# Patient Record
Sex: Female | Born: 1975 | Race: White | Hispanic: No | Marital: Married | Smoking: Never smoker
Health system: Southern US, Community
[De-identification: ages and names within clinical notes are randomized; demographics above are authoritative.]

## PROBLEM LIST (undated history)

## (undated) DIAGNOSIS — K589 Irritable bowel syndrome without diarrhea: Principal | ICD-10-CM

## (undated) DIAGNOSIS — L719 Rosacea, unspecified: Secondary | ICD-10-CM

## (undated) HISTORY — PX: ENDOMETRIAL ABLATION: SHX621

## (undated) HISTORY — DX: Irritable bowel syndrome without diarrhea: K58.9

## (undated) HISTORY — DX: Rosacea, unspecified: L71.9

---

## 2007-10-04 LAB — CONVERTED CEMR LAB

## 2008-03-19 ENCOUNTER — Ambulatory Visit: Payer: Self-pay | Admitting: Internal Medicine

## 2009-09-05 ENCOUNTER — Encounter (INDEPENDENT_AMBULATORY_CARE_PROVIDER_SITE_OTHER): Payer: Self-pay | Admitting: Obstetrics and Gynecology

## 2009-09-05 ENCOUNTER — Inpatient Hospital Stay (HOSPITAL_COMMUNITY): Admission: AD | Admit: 2009-09-05 | Discharge: 2009-09-08 | Payer: Self-pay | Admitting: Obstetrics and Gynecology

## 2010-03-25 ENCOUNTER — Encounter: Payer: Self-pay | Admitting: Internal Medicine

## 2010-07-05 NOTE — Miscellaneous (Signed)
Summary: flu shot  Clinical Lists Changes  Observations: Added new observation of FLU VAX: Historical (03/24/2010 11:23)      Immunization History:  Influenza Immunization History:    Influenza:  historical (03/24/2010) Vaccinse given at [facility] Lot# [id number] Exp 12/03/2010 Fl[facility]

## 2010-08-24 LAB — CBC
HCT: 31.6 % — ABNORMAL LOW (ref 36.0–46.0)
HCT: 43.2 % (ref 36.0–46.0)
Hemoglobin: 14.4 g/dL (ref 12.0–15.0)
MCHC: 33.3 g/dL (ref 30.0–36.0)
MCHC: 33.8 g/dL (ref 30.0–36.0)
MCV: 90.4 fL (ref 78.0–100.0)
RBC: 3.5 MIL/uL — ABNORMAL LOW (ref 3.87–5.11)
RBC: 4.82 MIL/uL (ref 3.87–5.11)
RDW: 13.2 % (ref 11.5–15.5)
WBC: 14.4 10*3/uL — ABNORMAL HIGH (ref 4.0–10.5)

## 2010-10-29 ENCOUNTER — Emergency Department (HOSPITAL_COMMUNITY): Payer: Private Health Insurance - Indemnity

## 2010-10-29 ENCOUNTER — Ambulatory Visit (INDEPENDENT_AMBULATORY_CARE_PROVIDER_SITE_OTHER): Payer: Private Health Insurance - Indemnity | Admitting: Family Medicine

## 2010-10-29 ENCOUNTER — Encounter: Payer: Self-pay | Admitting: Family Medicine

## 2010-10-29 ENCOUNTER — Telehealth: Payer: Self-pay | Admitting: *Deleted

## 2010-10-29 ENCOUNTER — Encounter: Payer: Self-pay | Admitting: *Deleted

## 2010-10-29 ENCOUNTER — Other Ambulatory Visit: Payer: Self-pay | Admitting: Family Medicine

## 2010-10-29 ENCOUNTER — Emergency Department (HOSPITAL_COMMUNITY)
Admission: EM | Admit: 2010-10-29 | Discharge: 2010-10-29 | Disposition: A | Discharge status: Home / Self Care | Payer: Private Health Insurance - Indemnity | Attending: Emergency Medicine | Admitting: Emergency Medicine

## 2010-10-29 DIAGNOSIS — R300 Dysuria: Secondary | ICD-10-CM | POA: Insufficient documentation

## 2010-10-29 DIAGNOSIS — R509 Fever, unspecified: Secondary | ICD-10-CM | POA: Insufficient documentation

## 2010-10-29 DIAGNOSIS — R1031 Right lower quadrant pain: Secondary | ICD-10-CM | POA: Insufficient documentation

## 2010-10-29 DIAGNOSIS — R110 Nausea: Secondary | ICD-10-CM | POA: Insufficient documentation

## 2010-10-29 DIAGNOSIS — N12 Tubulo-interstitial nephritis, not specified as acute or chronic: Secondary | ICD-10-CM | POA: Insufficient documentation

## 2010-10-29 DIAGNOSIS — N939 Abnormal uterine and vaginal bleeding, unspecified: Secondary | ICD-10-CM

## 2010-10-29 DIAGNOSIS — M545 Low back pain: Secondary | ICD-10-CM

## 2010-10-29 DIAGNOSIS — N898 Other specified noninflammatory disorders of vagina: Secondary | ICD-10-CM

## 2010-10-29 DIAGNOSIS — R109 Unspecified abdominal pain: Secondary | ICD-10-CM | POA: Insufficient documentation

## 2010-10-29 LAB — URINALYSIS, ROUTINE W REFLEX MICROSCOPIC
Glucose, UA: NEGATIVE mg/dL
Ketones, ur: 40 mg/dL — AB
pH: 6 (ref 5.0–8.0)

## 2010-10-29 LAB — CBC
HCT: 39.4 % (ref 36.0–46.0)
Hemoglobin: 13.4 g/dL (ref 12.0–15.0)
MCH: 29.5 pg (ref 26.0–34.0)
MCV: 86.8 fL (ref 78.0–100.0)
RBC: 4.54 MIL/uL (ref 3.87–5.11)

## 2010-10-29 LAB — DIFFERENTIAL
Basophils Relative: 0 % (ref 0–1)
Lymphocytes Relative: 7 % — ABNORMAL LOW (ref 12–46)
Lymphs Abs: 1.1 10*3/uL (ref 0.7–4.0)
Monocytes Relative: 11 % (ref 3–12)
Neutro Abs: 14.1 10*3/uL — ABNORMAL HIGH (ref 1.7–7.7)
Neutrophils Relative %: 83 % — ABNORMAL HIGH (ref 43–77)

## 2010-10-29 LAB — COMPREHENSIVE METABOLIC PANEL
AST: 18 U/L (ref 0–37)
Albumin: 3.7 g/dL (ref 3.5–5.2)
BUN: 7 mg/dL (ref 6–23)
Chloride: 100 mEq/L (ref 96–112)
Creatinine, Ser: 0.54 mg/dL (ref 0.4–1.2)
GFR calc Af Amer: >60 mL/min (ref >60)
Potassium: 3.5 mEq/L (ref 3.5–5.1)
Total Bilirubin: 0.7 mg/dL (ref 0.3–1.2)
Total Protein: 7.1 g/dL (ref 6.0–8.3)

## 2010-10-29 LAB — URINE MICROSCOPIC-ADD ON

## 2010-10-29 MED ORDER — IOHEXOL 300 MG/ML  SOLN
100.0000 mL | Freq: Once | INTRAMUSCULAR | Status: AC | PRN
  Administered 2010-10-29: 100 mL via INTRAVENOUS

## 2010-10-29 NOTE — Progress Notes (Signed)
Seen Thursday at [facility].  Was told she had a hernia at C/s line (c/s 09/2009).  Fever yesterday PM of 101.  Took some tylenol.  Woke shivering at 2AM.  Started spotting Monday/Tuesday (OB aware and pt was told to observe this), resolved but then had some spotting this AM.  Last regular period was 10/07/10.  No prev spotting like this before.  Had been using condoms.  Not known to be pregnant.  Lower back pain yesterday and upper back pain today.  No cough, ST. No sick contacts.  No diarrhea.  No pelvic pain.  She had some dysuria with burning and urgency, usually in the AM but then it self resolves later in the day, happening in the last week.  Fever documented here.    Otherwise healthy except for rosacea.   NKDA  Meds: Finacea and prenatal vitamin.  nad but sweaty ncat Mmm Tm wnl Neck supple w/o LA rrr ctab Back with R mild CVA pain Abdomen without rebound but epigastric tenderness and tenderness in RLQ at hernia site Ext well perfused.   Upreg pending (was sent to lab at [facility])

## 2010-10-29 NOTE — Telephone Encounter (Signed)
History update

## 2010-10-29 NOTE — Patient Instructions (Signed)
Go to the ER at [facility].   

## 2010-10-29 NOTE — Assessment & Plan Note (Addendum)
Unclear source.  With abd pain and spotting, she may need abd imaging along with urine eval.  I d/w EDP and patient. She'll go across the street to [facility] ER for further eval.  She agrees with plan.  No change on visit.   Addendum UPREG negative.

## 2010-10-31 LAB — URINE CULTURE: Culture  Setup Time: 201205262024

## 2010-11-01 ENCOUNTER — Telehealth: Payer: Self-pay | Admitting: Internal Medicine

## 2010-11-01 ENCOUNTER — Telehealth: Payer: Self-pay

## 2010-11-01 NOTE — Telephone Encounter (Signed)
[facility] Triage Call Report Triage Record Num: [id number] Operator: Albertine Grates Patient Name: Melissa Crawford Call Date & Time: 10/28/2010 5:56:01PM Patient Phone: [phone] PCP: Sanda Linger Patient Gender: Female PCP Fax : Patient DOB: 1976/03/01 Practice Name: [facility] Reason for Call: Was seen by GYN 5-24 and diagnosed with hernia. Has fever 101.3 and is nauseated 5-25. Denies pain. Has been drinking fluids. Home care advice given. Protocol(s) Used: Nausea or Vomiting Recommended Outcome per Protocol: Provide Home/Self Care Reason for Outcome: All other situations Care Advice: Call provider if symptoms continue for 24 hours, blood in vomit, severe abdominal pain, fever over 101.5 F (38.6 C), rapid breathing or pulse, or severe vomiting with diarrhea. ~ 10/28/2010 6:03:41PM Page 1 of 1 [email or web address]

## 2010-11-07 NOTE — Telephone Encounter (Signed)
Done

## 2010-12-06 ENCOUNTER — Encounter (INDEPENDENT_AMBULATORY_CARE_PROVIDER_SITE_OTHER): Payer: Self-pay | Admitting: Surgery

## 2010-12-15 ENCOUNTER — Encounter (INDEPENDENT_AMBULATORY_CARE_PROVIDER_SITE_OTHER): Payer: Self-pay | Admitting: Surgery

## 2010-12-19 ENCOUNTER — Ambulatory Visit (INDEPENDENT_AMBULATORY_CARE_PROVIDER_SITE_OTHER): Payer: Private Health Insurance - Indemnity | Admitting: Surgery

## 2010-12-19 DIAGNOSIS — R1032 Left lower quadrant pain: Secondary | ICD-10-CM

## 2010-12-19 NOTE — Progress Notes (Signed)
Subjective:     Patient ID: Melissa Crawford, female   DOB: 19-Oct-1975, 35 y.o.   MRN: [id number]  HPI  She is here for a one-month followup. She has resolved her pyelonephritis. She has had no episodes of groin pain. She has noticed no masses in her groin. She has been doing full activities without problems. Review of Systems     Objective:   Physical Exam    On exam, I feel no evidence of inguinal or femoral hernia.Assessment:     Resolved left groin pain    Plan:        I will see her as needed

## 2011-05-08 ENCOUNTER — Encounter: Payer: Self-pay | Admitting: Internal Medicine

## 2011-05-08 ENCOUNTER — Ambulatory Visit (INDEPENDENT_AMBULATORY_CARE_PROVIDER_SITE_OTHER): Payer: Private Health Insurance - Indemnity | Admitting: Internal Medicine

## 2011-05-08 VITALS — BP 110/80 | HR 62 | Temp 97.3°F | Resp 16 | Wt 136.2 lb

## 2011-05-08 DIAGNOSIS — J029 Acute pharyngitis, unspecified: Secondary | ICD-10-CM

## 2011-05-08 DIAGNOSIS — Z23 Encounter for immunization: Secondary | ICD-10-CM

## 2011-05-08 MED ORDER — AMOXICILLIN 500 MG PO CAPS: 500.0000 mg | ORAL_CAPSULE | Freq: Three times a day (TID) | ORAL | Status: AC

## 2011-05-08 NOTE — Progress Notes (Signed)
  Subjective:    Patient ID: Melissa Crawford, female    DOB: 1976/01/23, 35 y.o.   MRN: [id number]  Sore Throat  This is a new problem. Episode onset: 10 days. The problem has been unchanged. Neither side of throat is experiencing more pain than the other. There has been no fever. The pain is at a severity of 1/10. The pain is mild. Pertinent negatives include no abdominal pain, congestion, coughing, diarrhea, drooling, ear discharge, hoarse voice, plugged ear sensation, neck pain, shortness of breath, stridor, swollen glands, trouble swallowing or vomiting. She has had exposure to strep. She has tried nothing for the symptoms.      Review of Systems  Constitutional: Negative for fever, chills, diaphoresis, activity change, appetite change, fatigue and unexpected weight change.  HENT: Positive for sore throat. Negative for congestion, hoarse voice, facial swelling, drooling, trouble swallowing, neck pain, voice change and ear discharge.   Eyes: Negative.   Respiratory: Negative for apnea, cough, choking, chest tightness, shortness of breath, wheezing and stridor.   Cardiovascular: Negative for chest pain, palpitations and leg swelling.  Gastrointestinal: Negative for vomiting, abdominal pain, diarrhea and abdominal distention.  Genitourinary: Negative.   Musculoskeletal: Negative for myalgias, back pain, joint swelling, arthralgias and gait problem.  Skin: Negative for color change, pallor, rash and wound.  Neurological: Negative.   Hematological: Negative for adenopathy. Does not bruise/bleed easily.  Psychiatric/Behavioral: Negative.        Objective:   Physical Exam  Vitals reviewed. Constitutional: She is oriented to person, place, and time. She appears well-developed and well-nourished. No distress.  HENT:  Head: No trismus in the jaw.  Right Ear: Hearing, tympanic membrane, external ear and ear canal normal.  Left Ear: Hearing, tympanic membrane, external ear and ear canal normal.   Nose: Nose normal.  Mouth/Throat: Mucous membranes are normal. Mucous membranes are not pale, not dry and not cyanotic. No uvula swelling. Posterior oropharyngeal erythema present. No oropharyngeal exudate, posterior oropharyngeal edema or tonsillar abscesses.  Eyes: Conjunctivae are normal. Right eye exhibits no discharge. Left eye exhibits no discharge. No scleral icterus.  Neck: Normal range of motion. Neck supple. No JVD present. No tracheal deviation present. No thyromegaly present.  Cardiovascular: Normal rate, regular rhythm, normal heart sounds and intact distal pulses.  Exam reveals no gallop and no friction rub.   No murmur heard. Pulmonary/Chest: Effort normal and breath sounds normal. No stridor. No respiratory distress. She has no wheezes. She has no rales. She exhibits no tenderness.  Abdominal: Soft. Bowel sounds are normal. She exhibits no distension and no mass. There is no tenderness. There is no rebound.  Musculoskeletal: Normal range of motion. She exhibits no edema and no tenderness.  Lymphadenopathy:    She has no cervical adenopathy.  Neurological: She is oriented to person, place, and time.  Skin: Skin is warm and dry. No rash noted. She is not diaphoretic. No erythema. No pallor.  Psychiatric: She has a normal mood and affect. Her behavior is normal. Judgment and thought content normal.          Assessment & Plan:

## 2011-05-08 NOTE — Assessment & Plan Note (Addendum)
She has a known exposure to strep so will start amoxicillin and give her pt ed material was well

## 2011-05-08 NOTE — Patient Instructions (Signed)
Strep Throat     Strep throat is an infection of the throat caused by a bacteria named Streptococcus pyogenes. Your caregiver may call the infection streptococcal "tonsillitis" or "pharyngitis" depending on whether there are signs of inflammation in the tonsils or back of the throat. Strep throat is most common in children from 5 to 33 years old during the cold months of the year, but it can occur in people of any age during any season. This infection is spread from person to person (contagious) through coughing, sneezing, or other close contact.  SYMPTOMS   · Fever or chills.   · Painful, swollen, red tonsils or throat.   · Pain or difficulty when swallowing.   · White or yellow spots on the tonsils or throat.   · Swollen, tender lymph nodes or "glands" of the neck or under the jaw.   · Red rash all over the body (rare).   DIAGNOSIS   Many different infections can cause the same symptoms. A test must be done to confirm the diagnosis so the right treatment can be given. A "rapid strep test" can help your caregiver make the diagnosis in a few minutes. If this test is not available, a light swab of the infected area can be used for a throat culture test. If a throat culture test is done, results are usually available in a day or two.  TREATMENT   Strep throat is treated with antibiotic medicine.  HOME CARE INSTRUCTIONS   · Gargle with 1 tsp of salt in 1 cup of warm water, 3 to 4 times per day or as needed for comfort.   · Family members who also have a sore throat or fever should be tested for strep throat and treated with antibiotics if they have the strep infection.   · Make sure everyone in your household washes their hands well.   · Do not share food, drinking cups, or personal items that could cause the infection to spread to others.   · You may need to eat a soft food diet until your sore throat gets better.   · Drink enough water and fluids to keep your urine clear or pale yellow. This will help prevent  dehydration.   · Get plenty of rest.   · Stay home from school, daycare, or work until you have been on antibiotics for 24 hours.   · Only take over-the-counter or prescription medicines for pain, discomfort, or fever as directed by your caregiver.   · If antibiotics are prescribed, take them as directed. Finish them even if you start to feel better.   SEEK MEDICAL CARE IF:   · The glands in your neck continue to enlarge.   · You develop a rash, cough, or earache.   · You cough up green, yellow-brown, or bloody sputum.   · You have pain or discomfort not controlled by medicines.   · Your problems seem to be getting worse rather than better.   SEEK IMMEDIATE MEDICAL CARE IF:   · You develop any new symptoms such as vomiting, severe headache, stiff or painful neck, chest pain, shortness of breath, or trouble swallowing.   · You develop severe throat pain, drooling, or changes in your voice.   · You develop swelling of the neck, or the skin on the neck becomes red and tender.   · You have a fever.   · You develop signs of dehydration, such as fatigue, dry mouth, and decreased urination.   · 

## 2011-05-22 ENCOUNTER — Encounter: Payer: Self-pay | Admitting: Internal Medicine

## 2011-05-22 ENCOUNTER — Ambulatory Visit (INDEPENDENT_AMBULATORY_CARE_PROVIDER_SITE_OTHER): Payer: Private Health Insurance - Indemnity | Admitting: Internal Medicine

## 2011-05-22 ENCOUNTER — Other Ambulatory Visit: Payer: Private Health Insurance - Indemnity

## 2011-05-22 VITALS — BP 110/80 | HR 88 | Temp 98.2°F | Resp 16 | Wt 136.0 lb

## 2011-05-22 DIAGNOSIS — N390 Urinary tract infection, site not specified: Secondary | ICD-10-CM

## 2011-05-22 LAB — POCT URINALYSIS DIPSTICK
Ketones, UA: NEGATIVE
Spec Grav, UA: <=1.005
Urobilinogen, UA: 0.2
pH, UA: 7.5

## 2011-05-22 MED ORDER — SULFAMETHOXAZOLE-TRIMETHOPRIM 800-160 MG PO TABS: 1.0000 | ORAL_TABLET | Freq: Two times a day (BID) | ORAL | Status: DC

## 2011-05-22 NOTE — Assessment & Plan Note (Signed)
UA is + so will start bactrim and end for a clx

## 2011-05-22 NOTE — Patient Instructions (Signed)

## 2011-05-22 NOTE — Progress Notes (Signed)
  Subjective:    Patient ID: Melissa Crawford, female    DOB: 1976/03/22, 35 y.o.   MRN: [id number]  Dysuria  This is a new problem. The current episode started in the past 7 days. The problem occurs intermittently. The problem has been gradually worsening. The quality of the pain is described as burning. The pain is at a severity of 1/10. There has been no fever. She is sexually active. There is no history of pyelonephritis. Associated symptoms include flank pain, frequency and urgency. Pertinent negatives include no chills, discharge, hematuria, hesitancy, nausea, possible pregnancy, sweats or vomiting. She has tried nothing for the symptoms.      Review of Systems  Constitutional: Negative for fever, chills, diaphoresis, activity change, appetite change, fatigue and unexpected weight change.  HENT: Negative.   Eyes: Negative.   Respiratory: Negative.   Cardiovascular: Negative.  Negative for chest pain, palpitations and leg swelling.  Gastrointestinal: Negative for nausea, vomiting, diarrhea, constipation, blood in stool, abdominal distention and anal bleeding.  Genitourinary: Positive for dysuria, urgency, frequency and flank pain. Negative for hesitancy, hematuria, decreased urine volume, vaginal bleeding, vaginal discharge, difficulty urinating, vaginal pain, menstrual problem, pelvic pain and dyspareunia.  Musculoskeletal: Negative.   Skin: Negative.   Neurological: Negative.   Hematological: Negative for adenopathy. Does not bruise/bleed easily.  Psychiatric/Behavioral: Negative.        Objective:   Physical Exam  Vitals reviewed. Constitutional: She is oriented to person, place, and time. She appears well-developed and well-nourished. No distress.  HENT:  Mouth/Throat: Oropharynx is clear and moist. No oropharyngeal exudate.  Eyes: Conjunctivae are normal. Right eye exhibits no discharge. Left eye exhibits no discharge. No scleral icterus.  Neck: Normal range of motion. Neck  supple. No JVD present. No tracheal deviation present. No thyromegaly present.  Cardiovascular: Normal rate, regular rhythm, normal heart sounds and intact distal pulses.  Exam reveals no gallop and no friction rub.   No murmur heard. Pulmonary/Chest: Effort normal and breath sounds normal. No stridor. No respiratory distress. She has no wheezes. She has no rales. She exhibits no tenderness.  Abdominal: Normal appearance and bowel sounds are normal. She exhibits no shifting dullness, no distension, no pulsatile liver, no fluid wave, no abdominal bruit, no ascites, no pulsatile midline mass and no mass. There is no hepatosplenomegaly. There is no tenderness. There is no rebound, no guarding and no CVA tenderness. No hernia.  Musculoskeletal: Normal range of motion. She exhibits no edema and no tenderness.  Lymphadenopathy:    She has no cervical adenopathy.  Neurological: She is oriented to person, place, and time.  Skin: Skin is warm and dry. No rash noted. She is not diaphoretic. No erythema. No pallor.  Psychiatric: She has a normal mood and affect. Her behavior is normal. Judgment and thought content normal.          Assessment & Plan:

## 2011-05-26 ENCOUNTER — Encounter: Payer: Self-pay | Admitting: Internal Medicine

## 2011-05-26 ENCOUNTER — Ambulatory Visit (INDEPENDENT_AMBULATORY_CARE_PROVIDER_SITE_OTHER): Payer: Private Health Insurance - Indemnity | Admitting: Internal Medicine

## 2011-05-26 ENCOUNTER — Ambulatory Visit (INDEPENDENT_AMBULATORY_CARE_PROVIDER_SITE_OTHER)
Admission: RE | Admit: 2011-05-26 | Discharge: 2011-05-26 | Disposition: A | Discharge status: Home / Self Care | Payer: Private Health Insurance - Indemnity | Source: Ambulatory Visit | Attending: Internal Medicine | Admitting: Internal Medicine

## 2011-05-26 ENCOUNTER — Ambulatory Visit: Payer: Private Health Insurance - Indemnity

## 2011-05-26 DIAGNOSIS — R0789 Other chest pain: Secondary | ICD-10-CM

## 2011-05-26 DIAGNOSIS — R05 Cough: Secondary | ICD-10-CM

## 2011-05-26 DIAGNOSIS — R10811 Right upper quadrant abdominal tenderness: Secondary | ICD-10-CM

## 2011-05-26 DIAGNOSIS — R071 Chest pain on breathing: Secondary | ICD-10-CM

## 2011-05-26 DIAGNOSIS — J189 Pneumonia, unspecified organism: Secondary | ICD-10-CM

## 2011-05-26 DIAGNOSIS — N390 Urinary tract infection, site not specified: Secondary | ICD-10-CM

## 2011-05-26 LAB — COMPREHENSIVE METABOLIC PANEL
Albumin: 4.4 g/dL (ref 3.5–5.2)
Alkaline Phosphatase: 42 U/L (ref 39–117)
BUN: 11 mg/dL (ref 6–23)
Calcium: 9.2 mg/dL (ref 8.4–10.5)
Creatinine, Ser: 0.8 mg/dL (ref 0.4–1.2)
Glucose, Bld: 85 mg/dL (ref 70–99)
Potassium: 4 mEq/L (ref 3.5–5.1)

## 2011-05-26 LAB — LIPASE: Lipase: 36 U/L (ref 11.0–59.0)

## 2011-05-26 LAB — URINALYSIS, ROUTINE W REFLEX MICROSCOPIC
Ketones, ur: NEGATIVE
Leukocytes, UA: NEGATIVE
Urine Glucose: NEGATIVE
Urobilinogen, UA: 0.2 (ref 0.0–1.0)

## 2011-05-26 LAB — CBC WITH DIFFERENTIAL/PLATELET
Basophils Relative: 1.6 % (ref 0.0–3.0)
Eosinophils Absolute: 0.4 10*3/uL (ref 0.0–0.7)
Eosinophils Relative: 4.7 % (ref 0.0–5.0)
HCT: 40.9 % (ref 36.0–46.0)
Hemoglobin: 14.1 g/dL (ref 12.0–15.0)
Lymphs Abs: 2.6 10*3/uL (ref 0.7–4.0)
MCHC: 34.5 g/dL (ref 30.0–36.0)
MCV: 88.8 fl (ref 78.0–100.0)
Monocytes Absolute: 0.6 10*3/uL (ref 0.1–1.0)
Neutro Abs: 5.1 10*3/uL (ref 1.4–7.7)
Neutrophils Relative %: 57.3 % (ref 43.0–77.0)
RBC: 4.61 Mil/uL (ref 3.87–5.11)
WBC: 8.9 10*3/uL (ref 4.5–10.5)

## 2011-05-26 LAB — AMYLASE: Amylase: 63 U/L (ref 27–131)

## 2011-05-26 MED ORDER — HYDROCODONE-ACETAMINOPHEN 5-500 MG PO TABS: 2.0000 | ORAL_TABLET | Freq: Four times a day (QID) | ORAL | Status: AC | PRN

## 2011-05-26 NOTE — Progress Notes (Signed)
Subjective:    Patient ID: Melissa Crawford, female    DOB: [birth year], 35 y.o.   MRN: [id number]  Cough This is a new problem. The current episode started in the past 7 days. The problem has been gradually worsening. The problem occurs every few hours. The cough is non-productive. Associated symptoms include chest pain. Pertinent negatives include no chills, ear congestion, ear pain, fever, headaches, heartburn, hemoptysis, myalgias, nasal congestion, postnasal drip, rash, rhinorrhea, sore throat, shortness of breath, sweats, weight loss or wheezing. The symptoms are aggravated by cold air. She has tried OTC cough suppressant for the symptoms. The treatment provided mild relief.  Abdominal Pain This is a new problem. The current episode started in the past 7 days. The onset quality is gradual. The problem occurs intermittently. The problem has been unchanged. The pain is located in the RUQ. The pain is at a severity of 4/10. The pain is mild. The quality of the pain is sharp. The abdominal pain does not radiate. Pertinent negatives include no anorexia, arthralgias, belching, constipation, diarrhea, dysuria, fever, flatus, frequency, headaches, hematochezia, hematuria, melena, myalgias, nausea, vomiting or weight loss. The pain is aggravated by deep breathing. The pain is relieved by nothing. She has tried acetaminophen for the symptoms. The treatment provided no relief.  Chest Pain  This is a new problem. The current episode started in the past 7 days. The onset quality is sudden. The problem occurs intermittently. The problem has been unchanged. The pain is present in the lateral region (right lower anterior rib cage). The pain is at a severity of 3/10. The pain is moderate. The quality of the pain is described as sharp. The pain does not radiate. Associated symptoms include abdominal pain and a cough. Pertinent negatives include no back pain, diaphoresis, dizziness, exertional chest pressure, fever,  headaches, hemoptysis, irregular heartbeat, leg pain, malaise/fatigue, nausea, near-syncope, numbness, orthopnea, palpitations, PND, shortness of breath, sputum production, syncope, vomiting or weakness. The cough is non-productive. The pain is aggravated by coughing, deep breathing and movement. She has tried acetaminophen for the symptoms. The treatment provided no relief.      Review of Systems  Constitutional: Negative for fever, chills, weight loss, malaise/fatigue, diaphoresis, activity change, appetite change, fatigue and unexpected weight change.  HENT: Negative for ear pain, sore throat, rhinorrhea, trouble swallowing, voice change and postnasal drip.   Eyes: Negative.   Respiratory: Positive for cough. Negative for hemoptysis, sputum production, chest tightness, shortness of breath, wheezing and stridor.   Cardiovascular: Positive for chest pain. Negative for palpitations, orthopnea, leg swelling, syncope, PND and near-syncope.  Gastrointestinal: Positive for abdominal pain. Negative for heartburn, nausea, vomiting, diarrhea, constipation, blood in stool, melena, hematochezia, abdominal distention, anal bleeding, rectal pain, anorexia and flatus.  Genitourinary: Negative for dysuria, urgency, frequency, hematuria, flank pain, decreased urine volume, vaginal bleeding, vaginal discharge, enuresis, difficulty urinating, pelvic pain and dyspareunia.  Musculoskeletal: Negative for myalgias, back pain, joint swelling, arthralgias and gait problem.  Skin: Negative for color change, pallor, rash and wound.  Neurological: Negative for dizziness, weakness, numbness and headaches.  Hematological: Negative for adenopathy. Does not bruise/bleed easily.  Psychiatric/Behavioral: Negative.        Objective:   Physical Exam  Vitals reviewed. Constitutional: She is oriented to person, place, and time. She appears well-developed and well-nourished. No distress.  HENT:  Head: Normocephalic and  atraumatic.  Mouth/Throat: Oropharynx is clear and moist. No oropharyngeal exudate.  Eyes: Conjunctivae are normal. Right eye exhibits no discharge. Left eye exhibits no  discharge. No scleral icterus.  Neck: Normal range of motion. Neck supple. No JVD present. No tracheal deviation present. No thyromegaly present.  Cardiovascular: Normal rate, regular rhythm, normal heart sounds and intact distal pulses.  Exam reveals no gallop and no friction rub.   No murmur heard. Pulmonary/Chest: Effort normal and breath sounds normal. No stridor. No respiratory distress. She has no wheezes. She has no rales. Chest wall is not dull to percussion. She exhibits tenderness and bony tenderness. She exhibits no mass, no laceration, no crepitus, no edema, no deformity, no swelling and no retraction.    Abdominal: Soft. Normal appearance and bowel sounds are normal. She exhibits no shifting dullness, no distension, no pulsatile liver, no fluid wave, no abdominal bruit, no ascites, no pulsatile midline mass and no mass. There is no hepatosplenomegaly. There is tenderness in the right upper quadrant. There is no rigidity, no rebound, no guarding, no CVA tenderness, no tenderness at McBurney's point and negative Murphy's sign. No hernia. Hernia confirmed negative in the ventral area.  Musculoskeletal: Normal range of motion. She exhibits no edema and no tenderness.  Lymphadenopathy:    She has no cervical adenopathy.  Neurological: She is oriented to person, place, and time.  Skin: Skin is warm and dry. No rash noted. She is not diaphoretic. No erythema. No pallor.  Psychiatric: She has a normal mood and affect. Her behavior is normal. Judgment and thought content normal.          Assessment & Plan:

## 2011-05-26 NOTE — Patient Instructions (Signed)
Abdominal Pain Abdominal pain can be caused by many things. Your caregiver decides the seriousness of your pain by an examination and possibly blood tests and X-rays. Many cases can be observed and treated at home. Most abdominal pain is not caused by a disease and will probably improve without treatment. However, in many cases, more time must pass before a clear cause of the pain can be found. Before that point, it may not be known if you need more testing, or if hospitalization or surgery is needed. HOME CARE INSTRUCTIONS   Do not take laxatives unless directed by your caregiver.   Take pain medicine only as directed by your caregiver.   Only take over-the-counter or prescription medicines for pain, discomfort, or fever as directed by your caregiver.   Try a clear liquid diet (broth, tea, or water) for as long as directed by your caregiver. Slowly move to a bland diet as tolerated.  SEEK IMMEDIATE MEDICAL CARE IF:   The pain does not go away.   You have a fever.   You keep throwing up (vomiting).   The pain is felt only in portions of the abdomen. Pain in the right side could possibly be appendicitis. In an adult, pain in the left lower portion of the abdomen could be colitis or diverticulitis.   You pass bloody or black tarry stools.  MAKE SURE YOU:   Understand these instructions.   Will watch your condition.   Will get help right away if you are not doing well or get worse.  Document Released: 03/01/2005 Document Revised: 02/01/2011 Document Reviewed: 01/08/2008 [facility] Patient Information 2012 [place], [place].Chest Wall Pain Chest wall pain is pain in or around the bones and muscles of your chest. This may occur:   On its own (spontaneously).   After a viral illness such as the flu.   Through injur.   From coughing.   Minor exercise.  It may take up to 6 weeks to get better; longer if you must stay physically active in your work and activities. HOME CARE INSTRUCTIONS    Avoid over-tiring physical activity. Try not to strain or perform activities which cause pain. This would include any activities using chest, belly (abdominal) and side muscles, especially if heavy weights are used.   Use ice on the painful area for 15 to 20 minutes per hour while awake for the first 2 days. Place the ice in a plastic bag and place a towel between the bag of ice and your skin.   Only take over-the-counter or prescription medicines for pain, discomfort, or fever as directed by your caregiver.  SEEK IMMEDIATE MEDICAL CARE IF:   Your pain increases or you are very uncomfortable.   An oral temperature above 102 F (38.9 C)develops.   Your chest pains become worse.   You develop new, unexplained problems (symptoms).   You develop nausea, vomiting, sweating or feel light headed.   You develop a cough which produces phlegm (sputum) or you cough up blood.  MAKE SURE YOU:   Understand these instructions.   Will watch your condition.   Will get help right away if you are not doing well or get worse.  Document Released: 05/22/2005 Document Revised: 12/05/2010 Document Reviewed: 01/08/2008 [facility] Patient Information 2012 [place], [place].

## 2011-05-27 LAB — D-DIMER, QUANTITATIVE: D-Dimer, Quant: 0.45 ug/mL-FEU (ref 0.00–0.48)

## 2011-05-29 ENCOUNTER — Encounter: Payer: Self-pay | Admitting: Internal Medicine

## 2011-05-29 ENCOUNTER — Telehealth: Payer: Self-pay

## 2011-05-29 DIAGNOSIS — N390 Urinary tract infection, site not specified: Secondary | ICD-10-CM

## 2011-05-29 DIAGNOSIS — J189 Pneumonia, unspecified organism: Secondary | ICD-10-CM | POA: Insufficient documentation

## 2011-05-29 MED ORDER — SULFAMETHOXAZOLE-TRIMETHOPRIM 800-160 MG PO TABS: 1.0000 | ORAL_TABLET | Freq: Two times a day (BID) | ORAL | Status: AC

## 2011-05-29 MED ORDER — AZITHROMYCIN 500 MG PO TABS: 500.0000 mg | ORAL_TABLET | Freq: Every day | ORAL | Status: AC

## 2011-05-29 NOTE — Assessment & Plan Note (Signed)
Her CXR is + for bilateral atypical pneumonia so I sent in an Rx for her to start zpak

## 2011-05-29 NOTE — Assessment & Plan Note (Signed)
I will check her CXR to look for pna, mass, edema, etc. 

## 2011-05-29 NOTE — Assessment & Plan Note (Signed)
I will check her CXR to look for structural causes and will screen her for PE with a d-dimer

## 2011-05-29 NOTE — Assessment & Plan Note (Addendum)
I will check her labs to look for organ pathology (hepatitis, kidney stones, abnormal splenic function, etc)

## 2011-05-29 NOTE — Telephone Encounter (Signed)
Need rx sent to incorrect pharmacy// corrent pharmacy provided. Called patient//[facility] advising rx resent

## 2011-05-29 NOTE — Assessment & Plan Note (Signed)
I will recheck her urine today to see that this has resolved

## 2011-10-06 ENCOUNTER — Ambulatory Visit: Payer: Private Health Insurance - Indemnity | Admitting: Internal Medicine

## 2011-12-20 ENCOUNTER — Ambulatory Visit (INDEPENDENT_AMBULATORY_CARE_PROVIDER_SITE_OTHER): Payer: Private Health Insurance - Indemnity | Admitting: Internal Medicine

## 2011-12-20 ENCOUNTER — Encounter: Payer: Self-pay | Admitting: Internal Medicine

## 2011-12-20 VITALS — BP 120/84 | HR 64 | Temp 97.9°F | Resp 16 | Wt 141.0 lb

## 2011-12-20 DIAGNOSIS — N390 Urinary tract infection, site not specified: Secondary | ICD-10-CM

## 2011-12-20 LAB — POCT URINALYSIS DIPSTICK
Bilirubin, UA: NEGATIVE
Ketones, UA: NEGATIVE

## 2011-12-20 MED ORDER — NITROFURANTOIN MONOHYD MACRO 100 MG PO CAPS: 100.0000 mg | ORAL_CAPSULE | Freq: Two times a day (BID) | ORAL | Status: AC

## 2011-12-20 NOTE — Assessment & Plan Note (Signed)
I will treat the infection with nitrofurantoin

## 2011-12-20 NOTE — Patient Instructions (Signed)

## 2011-12-20 NOTE — Progress Notes (Signed)
  Subjective:    Patient ID: Melissa Crawford, female    DOB: Nov 14, 1975, 36 y.o.   MRN: [id number]  Urinary Tract Infection  This is a new problem. The current episode started yesterday. The problem occurs every urination. The problem has been gradually worsening. The quality of the pain is described as burning. The pain is at a severity of 2/10. The pain is mild. There has been no fever. The fever has been present for less than 1 day. She is sexually active. There is no history of pyelonephritis. Associated symptoms include chills, frequency and urgency. Pertinent negatives include no discharge, flank pain, hematuria, hesitancy, nausea, possible pregnancy, sweats or vomiting. She has tried nothing for the symptoms. Her past medical history is significant for recurrent UTIs.      Review of Systems  Constitutional: Positive for chills. Negative for fever, diaphoresis, activity change, appetite change, fatigue and unexpected weight change.  HENT: Negative.   Eyes: Negative.   Respiratory: Negative for cough, chest tightness, shortness of breath, wheezing and stridor.   Cardiovascular: Negative for chest pain, palpitations and leg swelling.  Gastrointestinal: Negative for nausea, vomiting, abdominal pain, diarrhea and constipation.  Genitourinary: Positive for dysuria, urgency and frequency. Negative for hesitancy, hematuria, flank pain, decreased urine volume, vaginal bleeding, vaginal discharge, enuresis, difficulty urinating, genital sores, vaginal pain, menstrual problem, pelvic pain and dyspareunia.  Musculoskeletal: Negative.   Skin: Negative.   Neurological: Negative.   Hematological: Negative for adenopathy. Does not bruise/bleed easily.  Psychiatric/Behavioral: Negative.        Objective:   Physical Exam  Vitals reviewed. Constitutional: She is oriented to person, place, and time. She appears well-developed and well-nourished.  Non-toxic appearance. She does not have a sickly  appearance. She does not appear ill. No distress.  HENT:  Head: Normocephalic and atraumatic.  Mouth/Throat: Oropharynx is clear and moist. No oropharyngeal exudate.  Eyes: Conjunctivae are normal. Right eye exhibits no discharge. Left eye exhibits no discharge. No scleral icterus.  Neck: Normal range of motion. Neck supple. No JVD present. No tracheal deviation present. No thyromegaly present.  Cardiovascular: Normal rate, regular rhythm, normal heart sounds and intact distal pulses.  Exam reveals no gallop and no friction rub.   No murmur heard. Pulmonary/Chest: Effort normal and breath sounds normal. No stridor. No respiratory distress. She has no wheezes. She has no rales. She exhibits no tenderness.  Abdominal: Soft. Normal appearance and bowel sounds are normal. She exhibits no distension and no mass. There is no hepatosplenomegaly. There is no tenderness. There is no rebound, no guarding and no CVA tenderness.  Musculoskeletal: Normal range of motion. She exhibits no edema and no tenderness.  Lymphadenopathy:    She has no cervical adenopathy.  Neurological: She is oriented to person, place, and time.  Skin: Skin is warm and dry. No rash noted. She is not diaphoretic. No erythema. No pallor.  Psychiatric: She has a normal mood and affect. Her behavior is normal. Judgment and thought content normal.          Assessment & Plan:

## 2011-12-21 LAB — HM PAP SMEAR: HM Pap smear: NORMAL

## 2012-12-05 ENCOUNTER — Ambulatory Visit (INDEPENDENT_AMBULATORY_CARE_PROVIDER_SITE_OTHER): Payer: Private Health Insurance - Indemnity | Admitting: Internal Medicine

## 2012-12-05 ENCOUNTER — Other Ambulatory Visit: Payer: Private Health Insurance - Indemnity

## 2012-12-05 ENCOUNTER — Encounter: Payer: Self-pay | Admitting: Internal Medicine

## 2012-12-05 VITALS — BP 110/78 | HR 60 | Temp 97.3°F | Resp 16 | Wt 142.0 lb

## 2012-12-05 DIAGNOSIS — N390 Urinary tract infection, site not specified: Secondary | ICD-10-CM

## 2012-12-05 DIAGNOSIS — R300 Dysuria: Secondary | ICD-10-CM

## 2012-12-05 LAB — POCT URINALYSIS DIPSTICK
Bilirubin, UA: NEGATIVE
Glucose, UA: NEGATIVE
Nitrite, UA: NEGATIVE

## 2012-12-05 MED ORDER — NITROFURANTOIN MONOHYD MACRO 100 MG PO CAPS
100.0000 mg | ORAL_CAPSULE | Freq: Two times a day (BID) | ORAL | Status: AC
Start: 2012-12-05 — End: 2012-12-12

## 2012-12-05 NOTE — Progress Notes (Signed)
  Subjective:    Patient ID: Melissa Crawford, female    DOB: [birth year], 37 y.o.   MRN: [id number]  Dysuria  This is a new problem. The current episode started yesterday. The problem occurs every urination. The problem has been unchanged. The quality of the pain is described as aching. The pain is at a severity of 1/10. The pain is mild. There has been no fever. The fever has been present for less than 1 day. She is sexually active. There is no history of pyelonephritis. Associated symptoms include frequency, hesitancy and urgency. Pertinent negatives include no chills, discharge, flank pain, hematuria, nausea, possible pregnancy, sweats or vomiting. She has tried nothing for the symptoms. The treatment provided no relief.      Review of Systems  Constitutional: Negative.  Negative for fever, chills, diaphoresis and fatigue.  HENT: Negative.   Eyes: Negative.   Respiratory: Negative.   Cardiovascular: Negative.   Gastrointestinal: Negative.  Negative for nausea, vomiting, abdominal pain, diarrhea and constipation.  Endocrine: Negative.   Genitourinary: Positive for dysuria, hesitancy, urgency and frequency. Negative for hematuria, flank pain, decreased urine volume, vaginal bleeding, vaginal discharge, enuresis, difficulty urinating, vaginal pain, pelvic pain and dyspareunia.  Musculoskeletal: Negative.   Skin: Negative.   Allergic/Immunologic: Negative.   Neurological: Negative.   Hematological: Negative.  Negative for adenopathy. Does not bruise/bleed easily.  Psychiatric/Behavioral: Negative.        Objective:   Physical Exam  Vitals reviewed. Constitutional: She is oriented to person, place, and time. She appears well-developed and well-nourished.  Non-toxic appearance. She does not have a sickly appearance. She does not appear ill. No distress.  HENT:  Head: Normocephalic and atraumatic.  Mouth/Throat: Oropharynx is clear and moist.  Eyes: Conjunctivae are normal. Right eye  exhibits no discharge. Left eye exhibits no discharge. No scleral icterus.  Neck: Normal range of motion. Neck supple. No JVD present. No tracheal deviation present. No thyromegaly present.  Cardiovascular: Normal rate, regular rhythm, normal heart sounds and intact distal pulses.  Exam reveals no gallop and no friction rub.   No murmur heard. Pulmonary/Chest: Effort normal and breath sounds normal. No stridor. No respiratory distress. She has no wheezes. She has no rales. She exhibits no tenderness.  Abdominal: Soft. Bowel sounds are normal. She exhibits no distension and no mass. There is no tenderness. There is no rebound and no guarding.  Musculoskeletal: Normal range of motion. She exhibits no edema and no tenderness.  Lymphadenopathy:    She has no cervical adenopathy.  Neurological: She is oriented to person, place, and time.  Skin: Skin is warm and dry. No rash noted. She is not diaphoretic. No erythema. No pallor.  Psychiatric: She has a normal mood and affect. Her behavior is normal. Judgment and thought content normal.          Assessment & Plan:

## 2012-12-05 NOTE — Patient Instructions (Signed)
Urinary Tract Infection  Urinary tract infections (UTIs) can develop anywhere along your urinary tract. Your urinary tract is your body's drainage system for removing wastes and extra water. Your urinary tract includes two kidneys, two ureters, a bladder, and a urethra. Your kidneys are a pair of bean-shaped organs. Each kidney is about the size of your fist. They are located below your ribs, one on each side of your spine.  CAUSES  Infections are caused by microbes, which are microscopic organisms, including fungi, viruses, and bacteria. These organisms are so small that they can only be seen through a microscope. Bacteria are the microbes that most commonly cause UTIs.  SYMPTOMS   Symptoms of UTIs may vary by age and gender of the patient and by the location of the infection. Symptoms in young women typically include a frequent and intense urge to urinate and a painful, burning feeling in the bladder or urethra during urination. Older women and men are more likely to be tired, shaky, and weak and have muscle aches and abdominal pain. A fever may mean the infection is in your kidneys. Other symptoms of a kidney infection include pain in your back or sides below the ribs, nausea, and vomiting.  DIAGNOSIS  To diagnose a UTI, your caregiver will ask you about your symptoms. Your caregiver also will ask to provide a urine sample. The urine sample will be tested for bacteria and white blood cells. White blood cells are made by your body to help fight infection.  TREATMENT   Typically, UTIs can be treated with medication. Because most UTIs are caused by a bacterial infection, they usually can be treated with the use of antibiotics. The choice of antibiotic and length of treatment depend on your symptoms and the type of bacteria causing your infection.  HOME CARE INSTRUCTIONS   If you were prescribed antibiotics, take them exactly as your caregiver instructs you. Finish the medication even if you feel better after you  have only taken some of the medication.   Drink enough water and fluids to keep your urine clear or pale yellow.   Avoid caffeine, tea, and carbonated beverages. They tend to irritate your bladder.   Empty your bladder often. Avoid holding urine for long periods of time.   Empty your bladder before and after sexual intercourse.   After a bowel movement, women should cleanse from front to back. Use each tissue only once.  SEEK MEDICAL CARE IF:    You have back pain.   You develop a fever.   Your symptoms do not begin to resolve within 3 days.  SEEK IMMEDIATE MEDICAL CARE IF:    You have severe back pain or lower abdominal pain.   You develop chills.   You have nausea or vomiting.   You have continued burning or discomfort with urination.  MAKE SURE YOU:    Understand these instructions.   Will watch your condition.   Will get help right away if you are not doing well or get worse.  Document Released: 03/01/2005 Document Revised: 11/21/2011 Document Reviewed: 06/30/2011  [employer] Patient Information 2014 [employer]

## 2012-12-05 NOTE — Assessment & Plan Note (Signed)
Start nitrofurantoin Check the urine culture Pt ed as well

## 2012-12-08 LAB — CULTURE, URINE COMPREHENSIVE: Colony Count: >=100000

## 2012-12-31 ENCOUNTER — Ambulatory Visit (INDEPENDENT_AMBULATORY_CARE_PROVIDER_SITE_OTHER): Payer: Private Health Insurance - Indemnity | Admitting: Internal Medicine

## 2012-12-31 ENCOUNTER — Other Ambulatory Visit (INDEPENDENT_AMBULATORY_CARE_PROVIDER_SITE_OTHER): Payer: Private Health Insurance - Indemnity

## 2012-12-31 ENCOUNTER — Encounter: Payer: Self-pay | Admitting: Internal Medicine

## 2012-12-31 VITALS — BP 108/74 | HR 67 | Temp 97.5°F | Resp 16 | Wt 142.0 lb

## 2012-12-31 DIAGNOSIS — K589 Irritable bowel syndrome without diarrhea: Secondary | ICD-10-CM

## 2012-12-31 DIAGNOSIS — Z0000 Encounter for general adult medical examination without abnormal findings: Secondary | ICD-10-CM | POA: Insufficient documentation

## 2012-12-31 DIAGNOSIS — K5900 Constipation, unspecified: Secondary | ICD-10-CM | POA: Insufficient documentation

## 2012-12-31 HISTORY — DX: Irritable bowel syndrome, unspecified: K58.9

## 2012-12-31 LAB — COMPREHENSIVE METABOLIC PANEL
Albumin: 4.3 g/dL (ref 3.5–5.2)
BUN: 7 mg/dL (ref 6–23)
CO2: 29 mEq/L (ref 19–32)
Calcium: 9.5 mg/dL (ref 8.4–10.5)
Chloride: 105 mEq/L (ref 96–112)
GFR: 123.33 mL/min (ref >60.00)
Glucose, Bld: 94 mg/dL (ref 70–99)
Potassium: 4.5 mEq/L (ref 3.5–5.1)
Sodium: 138 mEq/L (ref 135–145)
Total Protein: 7.3 g/dL (ref 6.0–8.3)

## 2012-12-31 LAB — CBC WITH DIFFERENTIAL/PLATELET
Basophils Relative: 0.9 % (ref 0.0–3.0)
Eosinophils Relative: 0.6 % (ref 0.0–5.0)
Lymphocytes Relative: 22.5 % (ref 12.0–46.0)
MCV: 90.1 fl (ref 78.0–100.0)
Monocytes Absolute: 0.5 10*3/uL (ref 0.1–1.0)
Monocytes Relative: 6.7 % (ref 3.0–12.0)
Neutrophils Relative %: 69.3 % (ref 43.0–77.0)
Platelets: 250 10*3/uL (ref 150.0–400.0)
RBC: 4.73 Mil/uL (ref 3.87–5.11)
WBC: 6.9 10*3/uL (ref 4.5–10.5)

## 2012-12-31 LAB — LIPID PANEL
Cholesterol: 180 mg/dL (ref 0–200)
LDL Cholesterol: 97 mg/dL (ref 0–99)
Triglycerides: 74 mg/dL (ref 0.0–149.0)

## 2012-12-31 MED ORDER — LINACLOTIDE 145 MCG PO CAPS: 145.0000 ug | ORAL_CAPSULE | Freq: Every day | ORAL | Status: DC

## 2012-12-31 NOTE — Assessment & Plan Note (Signed)
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

## 2012-12-31 NOTE — Assessment & Plan Note (Signed)
I have asked her to try linzesss, will start at the lower dose and will see how she tolerates it

## 2012-12-31 NOTE — Patient Instructions (Addendum)
Preventive Care for Adults, Female A healthy lifestyle and preventive care can promote health and wellness. Preventive health guidelines for women include the following key practices.  A routine yearly physical is a good way to check with your caregiver about your health and preventive screening. It is a chance to share any concerns and updates on your health, and to receive a thorough exam.  Visit your dentist for a routine exam and preventive care every 6 months. Brush your teeth twice a day and floss once a day. Good oral hygiene prevents tooth decay and gum disease.  The frequency of eye exams is based on your age, health, family medical history, use of contact lenses, and other factors. Follow your caregiver's recommendations for frequency of eye exams.  Eat a healthy diet. Foods like vegetables, fruits, whole grains, low-fat dairy products, and lean protein foods contain the nutrients you need without too many calories. Decrease your intake of foods high in solid fats, added sugars, and salt. Eat the right amount of calories for you.Get information about a proper diet from your caregiver, if necessary.  Regular physical exercise is one of the most important things you can do for your health. Most adults should get at least 150 minutes of moderate-intensity exercise (any activity that increases your heart rate and causes you to sweat) each week. In addition, most adults need muscle-strengthening exercises on 2 or more days a week.  Maintain a healthy weight. The body mass index (BMI) is a screening tool to identify possible weight problems. It provides an estimate of body fat based on height and weight. Your caregiver can help determine your BMI, and can help you achieve or maintain a healthy weight.For adults 20 years and older:  A BMI below 18.5 is considered underweight.  A BMI of 18.5 to 24.9 is normal.  A BMI of 25 to 29.9 is considered overweight.  A BMI of 30 and above is  considered obese.  Maintain normal blood lipids and cholesterol levels by exercising and minimizing your intake of saturated fat. Eat a balanced diet with plenty of fruit and vegetables. Blood tests for lipids and cholesterol should begin at age 20 and be repeated every 5 years. If your lipid or cholesterol levels are high, you are over 50, or you are at high risk for heart disease, you may need your cholesterol levels checked more frequently.Ongoing high lipid and cholesterol levels should be treated with medicines if diet and exercise are not effective.  If you smoke, find out from your caregiver how to quit. If you do not use tobacco, do not start.  If you are pregnant, do not drink alcohol. If you are breastfeeding, be very cautious about drinking alcohol. If you are not pregnant and choose to drink alcohol, do not exceed 1 drink per day. One drink is considered to be 12 ounces (355 mL) of beer, 5 ounces (148 mL) of wine, or 1.5 ounces (44 mL) of liquor.  Avoid use of street drugs. Do not share needles with anyone. Ask for help if you need support or instructions about stopping the use of drugs.  High blood pressure causes heart disease and increases the risk of stroke. Your blood pressure should be checked at least every 1 to 2 years. Ongoing high blood pressure should be treated with medicines if weight loss and exercise are not effective.  If you are 55 to 35 years old, ask your caregiver if you should take aspirin to prevent strokes.  Diabetes   screening involves taking a blood sample to check your fasting blood sugar level. This should be done once every 3 years, after age 45, if you are within normal weight and without risk factors for diabetes. Testing should be considered at a younger age or be carried out more frequently if you are overweight and have at least 1 risk factor for diabetes.  Breast cancer screening is essential preventive care for women. You should practice "breast  self-awareness." This means understanding the normal appearance and feel of your breasts and may include breast self-examination. Any changes detected, no matter how small, should be reported to a caregiver. Women in their 20s and 30s should have a clinical breast exam (CBE) by a caregiver as part of a regular health exam every 1 to 3 years. After age 40, women should have a CBE every year. Starting at age 40, women should consider having a mammography (breast X-ray test) every year. Women who have a family history of breast cancer should talk to their caregiver about genetic screening. Women at a high risk of breast cancer should talk to their caregivers about having magnetic resonance imaging (MRI) and a mammography every year.  The Pap test is a screening test for cervical cancer. A Pap test can show cell changes on the cervix that might become cervical cancer if left untreated. A Pap test is a procedure in which cells are obtained and examined from the lower end of the uterus (cervix).  Women should have a Pap test starting at age 21.  Between ages 21 and 29, Pap tests should be repeated every 2 years.  Beginning at age 30, you should have a Pap test every 3 years as long as the past 3 Pap tests have been normal.  Some women have medical problems that increase the chance of getting cervical cancer. Talk to your caregiver about these problems. It is especially important to talk to your caregiver if a new problem develops soon after your last Pap test. In these cases, your caregiver may recommend more frequent screening and Pap tests.  The above recommendations are the same for women who have or have not gotten the vaccine for human papillomavirus (HPV).  If you had a hysterectomy for a problem that was not cancer or a condition that could lead to cancer, then you no longer need Pap tests. Even if you no longer need a Pap test, a regular exam is a good idea to make sure no other problems are  starting.  If you are between ages 65 and 70, and you have had normal Pap tests going back 10 years, you no longer need Pap tests. Even if you no longer need a Pap test, a regular exam is a good idea to make sure no other problems are starting.  If you have had past treatment for cervical cancer or a condition that could lead to cancer, you need Pap tests and screening for cancer for at least 20 years after your treatment.  If Pap tests have been discontinued, risk factors (such as a new sexual partner) need to be reassessed to determine if screening should be resumed.  The HPV test is an additional test that may be used for cervical cancer screening. The HPV test looks for the virus that can cause the cell changes on the cervix. The cells collected during the Pap test can be tested for HPV. The HPV test could be used to screen women aged 30 years and older, and should   be used in women of any age who have unclear Pap test results. After the age of 30, women should have HPV testing at the same frequency as a Pap test.  Colorectal cancer can be detected and often prevented. Most routine colorectal cancer screening begins at the age of 50 and continues through age 75. However, your caregiver may recommend screening at an earlier age if you have risk factors for colon cancer. On a yearly basis, your caregiver may provide home test kits to check for hidden blood in the stool. Use of a small camera at the end of a tube, to directly examine the colon (sigmoidoscopy or colonoscopy), can detect the earliest forms of colorectal cancer. Talk to your caregiver about this at age 50, when routine screening begins. Direct examination of the colon should be repeated every 5 to 10 years through age 75, unless early forms of pre-cancerous polyps or small growths are found.  Hepatitis C blood testing is recommended for all people born from 1945 through 1965 and any individual with known risks for hepatitis C.  Practice  safe sex. Use condoms and avoid high-risk sexual practices to reduce the spread of sexually transmitted infections (STIs). STIs include gonorrhea, chlamydia, syphilis, trichomonas, herpes, HPV, and human immunodeficiency virus (HIV). Herpes, HIV, and HPV are viral illnesses that have no cure. They can result in disability, cancer, and death. Sexually active women aged 25 and younger should be checked for chlamydia. Older women with new or multiple partners should also be tested for chlamydia. Testing for other STIs is recommended if you are sexually active and at increased risk.  Osteoporosis is a disease in which the bones lose minerals and strength with aging. This can result in serious bone fractures. The risk of osteoporosis can be identified using a bone density scan. Women ages 65 and over and women at risk for fractures or osteoporosis should discuss screening with their caregivers. Ask your caregiver whether you should take a calcium supplement or vitamin D to reduce the rate of osteoporosis.  Menopause can be associated with physical symptoms and risks. Hormone replacement therapy is available to decrease symptoms and risks. You should talk to your caregiver about whether hormone replacement therapy is right for you.  Use sunscreen with sun protection factor (SPF) of 30 or more. Apply sunscreen liberally and repeatedly throughout the day. You should seek shade when your shadow is shorter than you. Protect yourself by wearing long sleeves, pants, a wide-brimmed hat, and sunglasses year round, whenever you are outdoors.  Once a month, do a whole body skin exam, using a mirror to look at the skin on your back. Notify your caregiver of new moles, moles that have irregular borders, moles that are larger than a pencil eraser, or moles that have changed in shape or color.  Stay current with required immunizations.  Influenza. You need a dose every fall (or winter). The composition of the flu vaccine  changes each year, so being vaccinated once is not enough.  Pneumococcal polysaccharide. You need 1 to 2 doses if you smoke cigarettes or if you have certain chronic medical conditions. You need 1 dose at age 65 (or older) if you have never been vaccinated.  Tetanus, diphtheria, pertussis (Tdap, Td). Get 1 dose of Tdap vaccine if you are younger than age 65, are over 65 and have contact with an infant, are a healthcare worker, are pregnant, or simply want to be protected from whooping cough. After that, you need a Td   booster dose every 10 years. Consult your caregiver if you have not had at least 3 tetanus and diphtheria-containing shots sometime in your life or have a deep or dirty wound.  HPV. You need this vaccine if you are a woman age 26 or younger. The vaccine is given in 3 doses over 6 months.  Measles, mumps, rubella (MMR). You need at least 1 dose of MMR if you were born in 1957 or later. You may also need a second dose.  Meningococcal. If you are age 19 to 21 and a first-year college student living in a residence hall, or have one of several medical conditions, you need to get vaccinated against meningococcal disease. You may also need additional booster doses.  Zoster (shingles). If you are age 60 or older, you should get this vaccine.  Varicella (chickenpox). If you have never had chickenpox or you were vaccinated but received only 1 dose, talk to your caregiver to find out if you need this vaccine.  Hepatitis A. You need this vaccine if you have a specific risk factor for hepatitis A virus infection or you simply wish to be protected from this disease. The vaccine is usually given as 2 doses, 6 to 18 months apart.  Hepatitis B. You need this vaccine if you have a specific risk factor for hepatitis B virus infection or you simply wish to be protected from this disease. The vaccine is given in 3 doses, usually over 6 months. Preventive Services / Frequency Ages 19 to 39  Blood  pressure check.** / Every 1 to 2 years.  Lipid and cholesterol check.** / Every 5 years beginning at age 20.  Clinical breast exam.** / Every 3 years for women in their 20s and 30s.  Pap test.** / Every 2 years from ages 21 through 29. Every 3 years starting at age 30 through age 65 or 70 with a history of 3 consecutive normal Pap tests.  HPV screening.** / Every 3 years from ages 30 through ages 65 to 70 with a history of 3 consecutive normal Pap tests.  Hepatitis C blood test.** / For any individual with known risks for hepatitis C.  Skin self-exam. / Monthly.  Influenza immunization.** / Every year.  Pneumococcal polysaccharide immunization.** / 1 to 2 doses if you smoke cigarettes or if you have certain chronic medical conditions.  Tetanus, diphtheria, pertussis (Tdap, Td) immunization. / A one-time dose of Tdap vaccine. After that, you need a Td booster dose every 10 years.  HPV immunization. / 3 doses over 6 months, if you are 26 and younger.  Measles, mumps, rubella (MMR) immunization. / You need at least 1 dose of MMR if you were born in 1957 or later. You may also need a second dose.  Meningococcal immunization. / 1 dose if you are age 19 to 21 and a first-year college student living in a residence hall, or have one of several medical conditions, you need to get vaccinated against meningococcal disease. You may also need additional booster doses.  Varicella immunization.** / Consult your caregiver.  Hepatitis A immunization.** / Consult your caregiver. 2 doses, 6 to 18 months apart.  Hepatitis B immunization.** / Consult your caregiver. 3 doses usually over 6 months. Ages 40 to 64  Blood pressure check.** / Every 1 to 2 years.  Lipid and cholesterol check.** / Every 5 years beginning at age 20.  Clinical breast exam.** / Every year after age 40.  Mammogram.** / Every year beginning at age 40   and continuing for as long as you are in good health. Consult with your  caregiver.  Pap test.** / Every 3 years starting at age 30 through age 65 or 70 with a history of 3 consecutive normal Pap tests.  HPV screening.** / Every 3 years from ages 30 through ages 65 to 70 with a history of 3 consecutive normal Pap tests.  Fecal occult blood test (FOBT) of stool. / Every year beginning at age 50 and continuing until age 75. You may not need to do this test if you get a colonoscopy every 10 years.  Flexible sigmoidoscopy or colonoscopy.** / Every 5 years for a flexible sigmoidoscopy or every 10 years for a colonoscopy beginning at age 50 and continuing until age 75.  Hepatitis C blood test.** / For all people born from 1945 through 1965 and any individual with known risks for hepatitis C.  Skin self-exam. / Monthly.  Influenza immunization.** / Every year.  Pneumococcal polysaccharide immunization.** / 1 to 2 doses if you smoke cigarettes or if you have certain chronic medical conditions.  Tetanus, diphtheria, pertussis (Tdap, Td) immunization.** / A one-time dose of Tdap vaccine. After that, you need a Td booster dose every 10 years.  Measles, mumps, rubella (MMR) immunization. / You need at least 1 dose of MMR if you were born in 1957 or later. You may also need a second dose.  Varicella immunization.** / Consult your caregiver.  Meningococcal immunization.** / Consult your caregiver.  Hepatitis A immunization.** / Consult your caregiver. 2 doses, 6 to 18 months apart.  Hepatitis B immunization.** / Consult your caregiver. 3 doses, usually over 6 months. Ages 65 and over  Blood pressure check.** / Every 1 to 2 years.  Lipid and cholesterol check.** / Every 5 years beginning at age 20.  Clinical breast exam.** / Every year after age 40.  Mammogram.** / Every year beginning at age 40 and continuing for as long as you are in good health. Consult with your caregiver.  Pap test.** / Every 3 years starting at age 30 through age 65 or 70 with a 3  consecutive normal Pap tests. Testing can be stopped between 65 and 70 with 3 consecutive normal Pap tests and no abnormal Pap or HPV tests in the past 10 years.  HPV screening.** / Every 3 years from ages 30 through ages 65 or 70 with a history of 3 consecutive normal Pap tests. Testing can be stopped between 65 and 70 with 3 consecutive normal Pap tests and no abnormal Pap or HPV tests in the past 10 years.  Fecal occult blood test (FOBT) of stool. / Every year beginning at age 50 and continuing until age 75. You may not need to do this test if you get a colonoscopy every 10 years.  Flexible sigmoidoscopy or colonoscopy.** / Every 5 years for a flexible sigmoidoscopy or every 10 years for a colonoscopy beginning at age 50 and continuing until age 75.  Hepatitis C blood test.** / For all people born from 1945 through 1965 and any individual with known risks for hepatitis C.  Osteoporosis screening.** / A one-time screening for women ages 65 and over and women at risk for fractures or osteoporosis.  Skin self-exam. / Monthly.  Influenza immunization.** / Every year.  Pneumococcal polysaccharide immunization.** / 1 dose at age 65 (or older) if you have never been vaccinated.  Tetanus, diphtheria, pertussis (Tdap, Td) immunization. / A one-time dose of Tdap vaccine if you are over   65 and have contact with an infant, are a Research scientist (physical sciences), or simply want to be protected from whooping cough. After that, you need a Td booster dose every 10 years.  Varicella immunization.** / Consult your caregiver.  Meningococcal immunization.** / Consult your caregiver.  Hepatitis A immunization.** / Consult your caregiver. 2 doses, 6 to 18 months apart.  Hepatitis B immunization.** / Check with your caregiver. 3 doses, usually over 6 months. ** Family history and personal history of risk and conditions may change your caregiver's recommendations. Document Released: 07/18/2001 Document Revised: 08/14/2011  Document Reviewed: 10/17/2010 [facility] Patient Information 2014 [place], [place]. Irritable Bowel Syndrome Irritable Bowel Syndrome (IBS) is caused by a disturbance of normal bowel function. Other terms used are spastic colon, mucous colitis, and irritable colon. It does not require surgery, nor does it lead to cancer. There is no cure for IBS. But with proper diet, stress reduction, and medication, you will find that your problems (symptoms) will gradually disappear or improve. IBS is a common digestive disorder. It usually appears in late adolescence or early adulthood. Women develop it twice as often as men. CAUSES  After food has been digested and absorbed in the small intestine, waste material is moved into the colon (large intestine). In the colon, water and salts are absorbed from the undigested products coming from the small intestine. The remaining residue, or fecal material, is held for elimination. Under normal circumstances, gentle, rhythmic contractions on the bowel walls push the fecal material along the colon towards the rectum. In IBS, however, these contractions are irregular and poorly coordinated. The fecal material is either retained too long, resulting in constipation, or expelled too soon, producing diarrhea. SYMPTOMS  The most common symptom of IBS is pain. It is typically in the lower left side of the belly (abdomen). But it may occur anywhere in the abdomen. It can be felt as heartburn, backache, or even as a dull pain in the arms or shoulders. The pain comes from excessive bowel-muscle spasms and from the buildup of gas and fecal material in the colon. This pain:  Can range from sharp belly (abdominal) cramps to a dull, continuous ache.  Usually worsens soon after eating.  Is typically relieved by having a bowel movement or passing gas. Abdominal pain is usually accompanied by constipation. But it may also produce diarrhea. The diarrhea typically occurs right after a meal or  upon arising in the morning. The stools are typically soft and watery. They are often flecked with secretions (mucus). Other symptoms of IBS include:  Bloating.  Loss of appetite.  Heartburn.  Feeling sick to your stomach (nausea).  Belching  Vomiting  Gas. IBS may also cause a number of symptoms that are unrelated to the digestive system:  Fatigue.  Headaches.  Anxiety  Shortness of breath  Difficulty in concentrating.  Dizziness. These symptoms tend to come and go. DIAGNOSIS  The symptoms of IBS closely mimic the symptoms of other, more serious digestive disorders. So your caregiver may wish to perform a variety of additional tests to exclude these disorders. He/she wants to be certain of learning what is wrong (diagnosis). The nature and purpose of each test will be explained to you. TREATMENT A number of medications are available to help correct bowel function and/or relieve bowel spasms and abdominal pain. Among the drugs available are:  Mild, non-irritating laxatives for severe constipation and to help restore normal bowel habits.  Specific anti-diarrheal medications to treat severe or prolonged diarrhea.  Anti-spasmodic  agents to relieve intestinal cramps.  Your caregiver may also decide to treat you with a mild tranquilizer or sedative during unusually stressful periods in your life. The important thing to remember is that if any drug is prescribed for you, make sure that you take it exactly as directed. Make sure that your caregiver knows how well it worked for you. HOME CARE INSTRUCTIONS   Avoid foods that are high in fat or oils. Some examples [id number] cream, butter, frankfurters, sausage, and other fatty meats.  Avoid foods that have a laxative effect, such as fruit, fruit juice, and dairy products.  Cut out carbonated drinks, chewing gum, and "gassy" foods, such as beans and cabbage. This may help relieve bloating and belching.  Bran taken with plenty  of liquids may help relieve constipation.  Keep track of what foods seem to trigger your symptoms.  Avoid emotionally charged situations or circumstances that produce anxiety.  Start or continue exercising.  Get plenty of rest and sleep. MAKE SURE YOU:   Understand these instructions.  Will watch your condition.  Will get help right away if you are not doing well or get worse. Document Released: 05/22/2005 Document Revised: 08/14/2011 Document Reviewed: 01/10/2008 [facility] Patient Information 2014 [place], [place].

## 2012-12-31 NOTE — Progress Notes (Signed)
  Subjective:    Patient ID: Melissa Crawford, female    DOB: 01-May-1976, 37 y.o.   MRN: [id number]  Constipation This is a chronic problem. The current episode started more than 1 year ago. The problem is unchanged. Her stool frequency is 2 to 3 times per week. The stool is described as formed and firm. The patient is on a high fiber diet. She exercises regularly. There has been adequate water intake. Associated symptoms include abdominal pain (vague discomfort and bloating) and bloating. Pertinent negatives include no anorexia, back pain, diarrhea, difficulty urinating, fecal incontinence, fever, flatus, hematochezia, hemorrhoids, melena, nausea, rectal pain, vomiting or weight loss. She has tried fiber for the symptoms. The treatment provided mild relief.      Review of Systems  Constitutional: Negative.  Negative for fever, chills, weight loss, diaphoresis, activity change, appetite change, fatigue and unexpected weight change.  HENT: Negative.   Eyes: Negative.   Respiratory: Negative.  Negative for cough, chest tightness, shortness of breath, wheezing and stridor.   Cardiovascular: Negative.  Negative for chest pain, palpitations and leg swelling.  Gastrointestinal: Positive for abdominal pain (vague discomfort and bloating), constipation and bloating. Negative for nausea, vomiting, diarrhea, melena, hematochezia, abdominal distention, anal bleeding, rectal pain, anorexia, flatus and hemorrhoids.  Endocrine: Negative.   Genitourinary: Negative.  Negative for difficulty urinating.  Musculoskeletal: Negative.  Negative for back pain.  Skin: Negative.   Allergic/Immunologic: Negative.   Neurological: Negative.   Hematological: Negative for adenopathy. Does not bruise/bleed easily.  Psychiatric/Behavioral: Negative.        Objective:   Physical Exam  Vitals reviewed. Constitutional: She is oriented to person, place, and time. She appears well-developed and well-nourished. No distress.   HENT:  Head: Normocephalic and atraumatic.  Mouth/Throat: Oropharynx is clear and moist. No oropharyngeal exudate.  Eyes: Conjunctivae are normal. Right eye exhibits no discharge. Left eye exhibits no discharge. No scleral icterus.  Neck: Normal range of motion. Neck supple. No JVD present. No tracheal deviation present. No thyromegaly present.  Cardiovascular: Normal rate, regular rhythm, normal heart sounds and intact distal pulses.  Exam reveals no gallop and no friction rub.   No murmur heard. Pulmonary/Chest: Effort normal and breath sounds normal. No stridor. No respiratory distress. She has no wheezes. She has no rales. She exhibits no tenderness.  Abdominal: Soft. Bowel sounds are normal. She exhibits no distension and no mass. There is no tenderness. There is no rebound and no guarding.  Musculoskeletal: Normal range of motion. She exhibits no edema and no tenderness.  Lymphadenopathy:    She has no cervical adenopathy.  Neurological: She is oriented to person, place, and time.  Skin: Skin is warm and dry. No rash noted. She is not diaphoretic. No erythema. No pallor.  Psychiatric: She has a normal mood and affect. Her behavior is normal. Judgment and thought content normal.     Lab Results  Component Value Date   WBC 8.9 05/26/2011   HGB 14.1 05/26/2011   HCT 40.9 05/26/2011   PLT 292.0 05/26/2011   GLUCOSE 85 05/26/2011   ALT 17 05/26/2011   AST 20 05/26/2011   NA 137 05/26/2011   K 4.0 05/26/2011   CL 102 05/26/2011   CREATININE 0.8 05/26/2011   BUN 11 05/26/2011   CO2 27 05/26/2011       Assessment & Plan:

## 2012-12-31 NOTE — Assessment & Plan Note (Signed)
Exam done Vaccines were reviewed Labs ordered Pt ed material was given 

## 2013-02-10 ENCOUNTER — Ambulatory Visit (INDEPENDENT_AMBULATORY_CARE_PROVIDER_SITE_OTHER): Payer: Private Health Insurance - Indemnity | Admitting: Internal Medicine

## 2013-02-10 ENCOUNTER — Encounter: Payer: Self-pay | Admitting: Internal Medicine

## 2013-02-10 VITALS — BP 114/70 | HR 88 | Temp 98.5°F | Resp 16 | Wt 140.0 lb

## 2013-02-10 DIAGNOSIS — Z23 Encounter for immunization: Secondary | ICD-10-CM

## 2013-02-10 DIAGNOSIS — K589 Irritable bowel syndrome without diarrhea: Secondary | ICD-10-CM

## 2013-02-10 DIAGNOSIS — K5900 Constipation, unspecified: Secondary | ICD-10-CM

## 2013-02-10 MED ORDER — LINACLOTIDE 145 MCG PO CAPS: 145.0000 ug | ORAL_CAPSULE | Freq: Every day | ORAL | Status: DC

## 2013-02-10 NOTE — Assessment & Plan Note (Signed)
She has had a great response to Linzess 

## 2013-02-10 NOTE — Assessment & Plan Note (Signed)
She is doing very well on Linzess

## 2013-02-10 NOTE — Patient Instructions (Signed)
Irritable Bowel Syndrome °Irritable Bowel Syndrome (IBS) is caused by a disturbance of normal bowel function. Other terms used are spastic colon, mucous colitis, and irritable colon. It does not require surgery, nor does it lead to cancer. There is no cure for IBS. But with proper diet, stress reduction, and medication, you will find that your problems (symptoms) will gradually disappear or improve. IBS is a common digestive disorder. It usually appears in late adolescence or early adulthood. Women develop it twice as often as men. °CAUSES  °After food has been digested and absorbed in the small intestine, waste material is moved into the colon (large intestine). In the colon, water and salts are absorbed from the undigested products coming from the small intestine. The remaining residue, or fecal material, is held for elimination. Under normal circumstances, gentle, rhythmic contractions on the bowel walls push the fecal material along the colon towards the rectum. In IBS, however, these contractions are irregular and poorly coordinated. The fecal material is either retained too long, resulting in constipation, or expelled too soon, producing diarrhea. °SYMPTOMS  °The most common symptom of IBS is pain. It is typically in the lower left side of the belly (abdomen). But it may occur anywhere in the abdomen. It can be felt as heartburn, backache, or even as a dull pain in the arms or shoulders. The pain comes from excessive bowel-muscle spasms and from the buildup of gas and fecal material in the colon. This pain: °· Can range from sharp belly (abdominal) cramps to a dull, continuous ache. °· Usually worsens soon after eating. °· Is typically relieved by having a bowel movement or passing gas. °Abdominal pain is usually accompanied by constipation. But it may also produce diarrhea. The diarrhea typically occurs right after a meal or upon arising in the morning. The stools are typically soft and watery. They are often  flecked with secretions (mucus). °Other symptoms of IBS include: °· Bloating. °· Loss of appetite. °· Heartburn. °· Feeling sick to your stomach (nausea). °· Belching °· Vomiting °· Gas. °IBS may also cause a number of symptoms that are unrelated to the digestive system: °· Fatigue. °· Headaches. °· Anxiety °· Shortness of breath °· Difficulty in concentrating. °· Dizziness. °These symptoms tend to come and go. °DIAGNOSIS  °The symptoms of IBS closely mimic the symptoms of other, more serious digestive disorders. So your caregiver may wish to perform a variety of additional tests to exclude these disorders. He/she wants to be certain of learning what is wrong (diagnosis). The nature and purpose of each test will be explained to you. °TREATMENT °A number of medications are available to help correct bowel function and/or relieve bowel spasms and abdominal pain. Among the drugs available are: °· Mild, non-irritating laxatives for severe constipation and to help restore normal bowel habits. °· Specific anti-diarrheal medications to treat severe or prolonged diarrhea. °· Anti-spasmodic agents to relieve intestinal cramps. °· Your caregiver may also decide to treat you with a mild tranquilizer or sedative during unusually stressful periods in your life. °The important thing to remember is that if any drug is prescribed for you, make sure that you take it exactly as directed. Make sure that your caregiver knows how well it worked for you. °HOME CARE INSTRUCTIONS  °· Avoid foods that are high in fat or oils. Some examples are:heavy cream, butter, frankfurters, sausage, and other fatty meats. °· Avoid foods that have a laxative effect, such as fruit, fruit juice, and dairy products. °· Cut out   carbonated drinks, chewing gum, and "gassy" foods, such as beans and cabbage. This may help relieve bloating and belching. °· Bran taken with plenty of liquids may help relieve constipation. °· Keep track of what foods seem to trigger  your symptoms. °· Avoid emotionally charged situations or circumstances that produce anxiety. °· Start or continue exercising. °· Get plenty of rest and sleep. °MAKE SURE YOU:  °· Understand these instructions. °· Will watch your condition. °· Will get help right away if you are not doing well or get worse. °Document Released: 05/22/2005 Document Revised: 08/14/2011 Document Reviewed: 01/10/2008 °[employer]® Patient Information ©2014 [employer]. ° °

## 2013-02-10 NOTE — Progress Notes (Signed)
  Subjective:    Patient ID: Melissa Crawford, female    DOB: [birth year], 37 y.o.   MRN: [id number]  Constipation This is a chronic problem. The current episode started more than 1 year ago. The problem has been resolved since onset. The patient is on a high fiber diet. There has been adequate water intake. Pertinent negatives include no abdominal pain, anorexia, back pain, bloating, diarrhea, difficulty urinating, fecal incontinence, fever, flatus, hematochezia, hemorrhoids, melena, nausea, rectal pain, vomiting or weight loss. Treatments tried: Linzess. The treatment provided significant relief.      Review of Systems  Constitutional: Negative for fever and weight loss.  Gastrointestinal: Positive for constipation. Negative for nausea, vomiting, abdominal pain, diarrhea, melena, hematochezia, rectal pain, bloating, anorexia, flatus and hemorrhoids.  Genitourinary: Negative for difficulty urinating.  Musculoskeletal: Negative for back pain.  All other systems reviewed and are negative.       Objective:   Physical Exam  Vitals reviewed. Constitutional: She is oriented to person, place, and time. She appears well-developed and well-nourished. No distress.  HENT:  Head: Normocephalic and atraumatic.  Mouth/Throat: Oropharynx is clear and moist. No oropharyngeal exudate.  Eyes: Conjunctivae are normal. Right eye exhibits no discharge. Left eye exhibits no discharge. No scleral icterus.  Neck: Normal range of motion. Neck supple. No JVD present. No tracheal deviation present. No thyromegaly present.  Cardiovascular: Normal rate, regular rhythm, normal heart sounds and intact distal pulses.  Exam reveals no gallop and no friction rub.   No murmur heard. Pulmonary/Chest: Effort normal and breath sounds normal. No stridor. No respiratory distress. She has no wheezes. She has no rales. She exhibits no tenderness.  Abdominal: Soft. Bowel sounds are normal. She exhibits no distension and no mass.  There is no tenderness. There is no rebound and no guarding.  Musculoskeletal: Normal range of motion. She exhibits no edema and no tenderness.  Lymphadenopathy:    She has no cervical adenopathy.  Neurological: She is oriented to person, place, and time.  Skin: Skin is warm and dry. No rash noted. She is not diaphoretic. No erythema. No pallor.     Lab Results  Component Value Date   WBC 6.9 12/31/2012   HGB 14.5 12/31/2012   HCT 42.6 12/31/2012   PLT 250.0 12/31/2012   GLUCOSE 94 12/31/2012   CHOL 180 12/31/2012   TRIG 74.0 12/31/2012   HDL 68.20 12/31/2012   LDLCALC 97 12/31/2012   ALT 15 12/31/2012   AST 18 12/31/2012   NA 138 12/31/2012   K 4.5 12/31/2012   CL 105 12/31/2012   CREATININE 0.6 12/31/2012   BUN 7 12/31/2012   CO2 29 12/31/2012   TSH 1.26 12/31/2012        Assessment & Plan:

## 2013-02-17 ENCOUNTER — Other Ambulatory Visit: Payer: Self-pay | Admitting: Obstetrics and Gynecology

## 2013-02-17 DIAGNOSIS — Z803 Family history of malignant neoplasm of breast: Secondary | ICD-10-CM

## 2013-02-25 ENCOUNTER — Ambulatory Visit
Admission: RE | Admit: 2013-02-25 | Discharge: 2013-02-25 | Disposition: A | Discharge status: Home / Self Care | Payer: Private Health Insurance - Indemnity | Source: Ambulatory Visit | Attending: Obstetrics and Gynecology | Admitting: Obstetrics and Gynecology

## 2013-02-25 DIAGNOSIS — Z803 Family history of malignant neoplasm of breast: Secondary | ICD-10-CM

## 2013-02-25 MED ORDER — GADOBENATE DIMEGLUMINE 529 MG/ML IV SOLN
12.0000 mL | Freq: Once | INTRAVENOUS | Status: AC | PRN
  Administered 2013-02-25: 12 mL via INTRAVENOUS

## 2013-06-13 ENCOUNTER — Telehealth: Payer: Self-pay | Admitting: *Deleted

## 2013-06-13 DIAGNOSIS — K589 Irritable bowel syndrome without diarrhea: Secondary | ICD-10-CM

## 2013-06-13 DIAGNOSIS — K5900 Constipation, unspecified: Secondary | ICD-10-CM

## 2013-06-13 MED ORDER — LINACLOTIDE 145 MCG PO CAPS: 145.0000 ug | ORAL_CAPSULE | Freq: Every day | ORAL | Status: DC

## 2013-06-13 NOTE — Telephone Encounter (Signed)
Pt phoned requesting 90 day supply of Linzess.  Last OV 02/10/2013.  Scripts printed & awaiting MD signature per refill protocol.

## 2013-06-13 NOTE — Telephone Encounter (Signed)
Called pt to confirm pharmacy. She requested this sent to [facility] mail order, Rx sent e-script

## 2013-06-16 ENCOUNTER — Other Ambulatory Visit: Payer: Self-pay | Admitting: *Deleted

## 2013-06-16 DIAGNOSIS — K589 Irritable bowel syndrome without diarrhea: Secondary | ICD-10-CM

## 2013-06-16 DIAGNOSIS — K5900 Constipation, unspecified: Secondary | ICD-10-CM

## 2013-06-16 MED ORDER — LINACLOTIDE 145 MCG PO CAPS: 145.0000 ug | ORAL_CAPSULE | Freq: Every day | ORAL | Status: DC

## 2013-06-16 NOTE — Telephone Encounter (Signed)
Left voicemail message for patient, notifying her that refill request for linzess had been submit via escribe to her mail order pharmacy.

## 2013-11-12 LAB — HM PAP SMEAR: HM PAP: NORMAL

## 2014-06-07 ENCOUNTER — Other Ambulatory Visit: Payer: Self-pay | Admitting: Internal Medicine

## 2014-06-09 ENCOUNTER — Other Ambulatory Visit: Payer: Self-pay | Admitting: Internal Medicine

## 2014-06-09 DIAGNOSIS — K5909 Other constipation: Secondary | ICD-10-CM

## 2014-06-09 DIAGNOSIS — K589 Irritable bowel syndrome without diarrhea: Secondary | ICD-10-CM

## 2014-06-09 MED ORDER — LINACLOTIDE 145 MCG PO CAPS: 145.0000 ug | ORAL_CAPSULE | Freq: Every day | ORAL | Status: DC

## 2014-06-09 NOTE — Addendum Note (Signed)
Addended by: Etta GrandchildJONES, Shakeita Vandevander L on: 06/09/2014 07:44 AM   Modules accepted: Orders

## 2014-06-09 NOTE — Addendum Note (Signed)
Addended by: Etta GrandchildJONES, Davionna Blacksher L on: 06/09/2014 07:37 AM   Modules accepted: Orders, Medications

## 2014-10-20 ENCOUNTER — Encounter: Payer: Self-pay | Admitting: Internal Medicine

## 2014-10-20 ENCOUNTER — Other Ambulatory Visit: Payer: Self-pay | Admitting: Internal Medicine

## 2014-10-20 DIAGNOSIS — F329 Major depressive disorder, single episode, unspecified: Secondary | ICD-10-CM | POA: Insufficient documentation

## 2014-10-20 DIAGNOSIS — F32A Depression, unspecified: Secondary | ICD-10-CM | POA: Insufficient documentation

## 2014-10-20 MED ORDER — BUPROPION HCL ER (XL) 150 MG PO TB24: 150.0000 mg | ORAL_TABLET | Freq: Every day | ORAL | Status: DC

## 2014-10-28 ENCOUNTER — Encounter: Payer: Self-pay | Admitting: Internal Medicine

## 2014-10-28 ENCOUNTER — Other Ambulatory Visit (INDEPENDENT_AMBULATORY_CARE_PROVIDER_SITE_OTHER): Payer: Managed Care, Other (non HMO)

## 2014-10-28 ENCOUNTER — Ambulatory Visit (INDEPENDENT_AMBULATORY_CARE_PROVIDER_SITE_OTHER): Payer: Managed Care, Other (non HMO) | Admitting: Internal Medicine

## 2014-10-28 VITALS — BP 128/88 | HR 59 | Temp 97.8°F | Resp 16 | Ht 67.0 in | Wt 130.0 lb

## 2014-10-28 DIAGNOSIS — F32A Depression, unspecified: Secondary | ICD-10-CM

## 2014-10-28 DIAGNOSIS — F329 Major depressive disorder, single episode, unspecified: Secondary | ICD-10-CM | POA: Diagnosis not present

## 2014-10-28 DIAGNOSIS — Z0000 Encounter for general adult medical examination without abnormal findings: Secondary | ICD-10-CM

## 2014-10-28 LAB — COMPREHENSIVE METABOLIC PANEL
ALBUMIN: 4.4 g/dL (ref 3.5–5.2)
ALT: 12 U/L (ref 0–35)
AST: 15 U/L (ref 0–37)
Alkaline Phosphatase: 41 U/L (ref 39–117)
BUN: 8 mg/dL (ref 6–23)
CO2: 27 mEq/L (ref 19–32)
Calcium: 9.3 mg/dL (ref 8.4–10.5)
Chloride: 104 mEq/L (ref 96–112)
Creatinine, Ser: 0.67 mg/dL (ref 0.40–1.20)
GFR: 105.4 mL/min (ref >60.00)
Glucose, Bld: 86 mg/dL (ref 70–99)
Potassium: 4 mEq/L (ref 3.5–5.1)
SODIUM: 137 meq/L (ref 135–145)
Total Bilirubin: 0.4 mg/dL (ref 0.2–1.2)
Total Protein: 7.5 g/dL (ref 6.0–8.3)

## 2014-10-28 LAB — LIPID PANEL
CHOL/HDL RATIO: 2
CHOLESTEROL: 163 mg/dL (ref 0–200)
HDL: 74.4 mg/dL (ref >39.00)
LDL Cholesterol: 77 mg/dL (ref 0–99)
NonHDL: 88.6
TRIGLYCERIDES: 59 mg/dL (ref 0.0–149.0)
VLDL: 11.8 mg/dL (ref 0.0–40.0)

## 2014-10-28 LAB — CBC WITH DIFFERENTIAL/PLATELET
BASOS PCT: 1.4 % (ref 0.0–3.0)
Basophils Absolute: 0.1 10*3/uL (ref 0.0–0.1)
EOS ABS: 0 10*3/uL (ref 0.0–0.7)
Eosinophils Relative: 0.6 % (ref 0.0–5.0)
HEMATOCRIT: 38.9 % (ref 36.0–46.0)
Hemoglobin: 13.2 g/dL (ref 12.0–15.0)
LYMPHS PCT: 27.2 % (ref 12.0–46.0)
Lymphs Abs: 1.5 10*3/uL (ref 0.7–4.0)
MCHC: 34 g/dL (ref 30.0–36.0)
MCV: 86.4 fl (ref 78.0–100.0)
MONO ABS: 0.5 10*3/uL (ref 0.1–1.0)
Monocytes Relative: 9.4 % (ref 3.0–12.0)
NEUTROS ABS: 3.3 10*3/uL (ref 1.4–7.7)
NEUTROS PCT: 61.4 % (ref 43.0–77.0)
PLATELETS: 259 10*3/uL (ref 150.0–400.0)
RBC: 4.51 Mil/uL (ref 3.87–5.11)
RDW: 13 % (ref 11.5–15.5)
WBC: 5.3 10*3/uL (ref 4.0–10.5)

## 2014-10-28 LAB — TSH: TSH: 1.36 u[IU]/mL (ref 0.35–4.50)

## 2014-10-28 NOTE — Progress Notes (Signed)
Pre visit review using our clinic review tool, if applicable. No additional management support is needed unless otherwise documented below in the visit note. 

## 2014-10-28 NOTE — Progress Notes (Signed)
Subjective:  Patient ID: Melissa Crawford, female    DOB: [birth year]  Age: 39 y.o. MRN: [id number]  CC: Depression and Annual Exam   HPI Melissa Crawford presents for a CPX - she has been thru a lot of stress over the last year - she and her husband are separated and she has lot of work related stress, she complains of hypersomnolence, sadness, crying spells, and anhedonia.  Outpatient Prescriptions Prior to Visit  Medication Sig Dispense Refill  . buPROPion (WELLBUTRIN XL) 150 MG 24 hr tablet Take 1 tablet (150 mg total) by mouth daily. 90 tablet 1  . Linaclotide (LINZESS) 145 MCG CAPS capsule Take 1 capsule (145 mcg total) by mouth daily. 90 capsule 3   No facility-administered medications prior to visit.    ROS Review of Systems  Constitutional: Positive for fatigue. Negative for fever, chills, diaphoresis and appetite change.  HENT: Negative.   Eyes: Negative.   Respiratory: Negative.  Negative for cough, choking, chest tightness, shortness of breath and stridor.   Cardiovascular: Negative.  Negative for chest pain, palpitations and leg swelling.  Gastrointestinal: Positive for constipation. Negative for abdominal pain.  Endocrine: Negative.   Genitourinary: Negative.   Musculoskeletal: Negative.   Skin: Negative.   Allergic/Immunologic: Negative.   Neurological: Negative.   Hematological: Negative.  Negative for adenopathy. Does not bruise/bleed easily.  Psychiatric/Behavioral: Positive for sleep disturbance and dysphoric mood. Negative for suicidal ideas, hallucinations, behavioral problems, confusion, self-injury, decreased concentration and agitation. The patient is not nervous/anxious and is not hyperactive.     Objective:  BP 128/88 mmHg  Pulse 59  Temp(Src) 97.8 F (36.6 C) (Oral)  Resp 16  Ht 5\' 7"  (1.702 m)  Wt 130 lb (58.968 kg)  BMI 20.36 kg/m2  SpO2 99%  LMP 10/20/2014 (Exact Date)  BP Readings from Last 3 Encounters:  10/28/14 128/88  02/10/13 114/70    12/31/12 108/74    Wt Readings from Last 3 Encounters:  10/28/14 130 lb (58.968 kg)  02/10/13 140 lb (63.504 kg)  12/31/12 142 lb (64.411 kg)    Physical Exam  Lab Results  Component Value Date   WBC 5.3 10/28/2014   HGB 13.2 10/28/2014   HCT 38.9 10/28/2014   PLT 259.0 10/28/2014   GLUCOSE 86 10/28/2014   CHOL 163 10/28/2014   TRIG 59.0 10/28/2014   HDL 74.40 10/28/2014   LDLCALC 77 10/28/2014   ALT 12 10/28/2014   AST 15 10/28/2014   NA 137 10/28/2014   K 4.0 10/28/2014   CL 104 10/28/2014   CREATININE 0.67 10/28/2014   BUN 8 10/28/2014   CO2 27 10/28/2014   TSH 1.36 10/28/2014    Mr Breast Bilateral W Wo Contrast  02/26/2013   *RADIOLOGY REPORT*  Clinical Data:Family history of breast cancer in the patient's mother at the age of 37.  Dense fibroglandular tissue.  BILATERAL BREAST MRI WITH AND WITHOUT CONTRAST  Technique: Multiplanar, multisequence MR images of both breasts were obtained prior to and following the intravenous administration of 12ml of multihance.  THREE-DIMENSIONAL MR IMAGE RENDERING ON INDEPENDENT WORKSTATION: Three-dimensional MR images were rendered by post-processing  of the original MR data on an independent DynaCad workstation. The three-dimensional MR images were interpreted, and findings are reported in the following complete MRI report for this study.  Comparison:  Recent imaging examinations.  FINDINGS:  Breast composition:  c:  Heterogeneous fibroglandular tissue  Background parenchymal enhancement: Moderate.  Right breast:  No mass or abnormal enhancement.  Left breast:  No mass or abnormal enhancement.  Lymph nodes:  No abnormal appearing lymph nodes.  Ancillary findings:  None.  IMPRESSION: Negative breast MRI.  RECOMMENDATION: If the clinical exam remains benign/stable screening mammography can be deferred until the age of 58.  BI-RADS CATEGORY 1:  Negative.   Original Report Authenticated By: Baird Lyons, M.D.    Assessment & Plan:   [name]  was seen today for depression and annual exam.  Diagnoses and all orders for this visit:  Routine general medical examination at a health care facility Orders: -     Lipid panel; Future -     Comprehensive metabolic panel; Future -     CBC with Differential/Platelet; Future -     TSH; Future  Depression, acute - she started wellbutrin about 1 week ago and feels some improvement, will consider increasing the dose and/or adding fetzima   I am having [name] maintain her Linaclotide and buPROPion.  No orders of the defined types were placed in this encounter.     Follow-up: Return in about 4 weeks (around 11/25/2014).  Sanda Linger, MD

## 2014-10-28 NOTE — Patient Instructions (Signed)

## 2015-01-19 ENCOUNTER — Encounter: Payer: Self-pay | Admitting: Internal Medicine

## 2015-02-04 ENCOUNTER — Other Ambulatory Visit: Payer: Self-pay | Admitting: Obstetrics and Gynecology

## 2015-02-05 LAB — CYTOLOGY - PAP

## 2015-02-25 ENCOUNTER — Ambulatory Visit (INDEPENDENT_AMBULATORY_CARE_PROVIDER_SITE_OTHER): Payer: Managed Care, Other (non HMO) | Admitting: Internal Medicine

## 2015-02-25 ENCOUNTER — Encounter: Payer: Self-pay | Admitting: Internal Medicine

## 2015-02-25 VITALS — BP 106/62 | HR 68 | Ht 66.0 in | Wt 141.1 lb

## 2015-02-25 DIAGNOSIS — R140 Abdominal distension (gaseous): Secondary | ICD-10-CM | POA: Diagnosis not present

## 2015-02-25 DIAGNOSIS — K589 Irritable bowel syndrome without diarrhea: Secondary | ICD-10-CM

## 2015-02-25 NOTE — Patient Instructions (Signed)
   See if there are any other gas-forming foods that you can eliminate  Use Beano as we discussed - follow directions on bottle  If that is not helping try VSL #3 probiotic 2/day x 2 months  Let me know if that does not help  I appreciate the opportunity to care for you. Iva Boop, MD, Clementeen Graham

## 2015-02-25 NOTE — Progress Notes (Signed)
  Referred by: Etta Grandchild, MD  Subjective:    Patient ID: Melissa Crawford, female    DOB: [birth year], 39 y.o.   MRN: [id number] Cc: bloating HPI During eyes married 39 year old woman with a long history of what she describes as IBS. She remembers taking Metamucil as a child because of constipation. 2 years ago she started Linzess at lower dose and it has relieved constipation but she continues to have problems with bloating. She has seen an allergist and has been checked for celiac disease and says she is negative. She has eliminated dairy gluten caffeine and sugar at various times without help. Essentially she describes progressive bloating throughout the day. It's more severe by night. When she awakens in the morning it's okay but then she has more distention. She eats a lot beans in her diet because she does not eat meat. He has never tried Beano. She does not have any rectal bleeding. She does believe she might have some hemorrhoid symptoms at times. There is no unintentional weight loss. Wt Readings from Last 3 Encounters:  02/25/15 141 lb 2 oz (64.014 kg)  10/28/14 130 lb (58.968 kg)  02/10/13 140 lb (63.504 kg)  Medications, allergies, past medical history, past surgical history, family history and social history are reviewed and updated in the EMR.  Review of Systems Positive for some menstrual pain and headaches. All other review of systems are negative    Objective:   Physical Exam  106/62 mmHg  Pulse 68  Ht  (1.676 m)  Wt 141 lb 2 oz (64.014 kg)  BMI 22.79 kg/m2  LMP 02/09/2015 (Exact Date)@  General:  Well-developed, well-nourished and in no acute distress Eyes:  anicteric. Lungs: Clear to auscultation bilaterally. Heart:  S1S2, no rubs, murmurs, gallops. Abdomen: scaphoid soft, non-tender, no hepatosplenomegaly, hernia, or mass and BS+.  Neuro:  A&O x 3.  Psych:  appropriate mood and  Affect.   Data Reviewed: Primary care notes labs in the EMR          Assessment & Plan:  IBS (irritable bowel syndrome)-Constipation Predominant  Bloating IBS (irritable bowel syndrome)-Constipation Predominant Try to rduce any gas-forming foods Beano - she eats a lot of beans and no meat Next add VSL # 3 2/day Next try higher dose Linzess F/u by My Chart   she does have rosacea and there is sometimes up to be some link the small bowel intestinal overgrowth and that. Possibility of using and probiotics versus breath testing if available can be considered in the future as well.  I appreciate the opportunity to care for this patient. CC: Sanda Linger, MD

## 2015-02-25 NOTE — Assessment & Plan Note (Signed)
Try to rduce any gas-forming foods Beano - she eats a lot of beans and no meat Next add VSL # 3 2/day Next try higher dose Linzess F/u by My Chart

## 2015-04-05 ENCOUNTER — Encounter: Payer: Self-pay | Admitting: Internal Medicine

## 2015-04-05 ENCOUNTER — Other Ambulatory Visit: Payer: Self-pay | Admitting: Internal Medicine

## 2015-04-05 MED ORDER — SULFAMETHOXAZOLE-TRIMETHOPRIM 800-160 MG PO TABS: 1.0000 | ORAL_TABLET | Freq: Two times a day (BID) | ORAL | Status: AC

## 2015-06-17 ENCOUNTER — Other Ambulatory Visit: Payer: Self-pay | Admitting: Internal Medicine

## 2015-06-28 ENCOUNTER — Encounter: Payer: Self-pay | Admitting: Family Medicine

## 2015-06-28 ENCOUNTER — Ambulatory Visit (INDEPENDENT_AMBULATORY_CARE_PROVIDER_SITE_OTHER): Payer: Managed Care, Other (non HMO) | Admitting: Family Medicine

## 2015-06-28 VITALS — BP 122/80 | HR 75 | Temp 98.0°F | Resp 20 | Wt 140.8 lb

## 2015-06-28 DIAGNOSIS — J0100 Acute maxillary sinusitis, unspecified: Secondary | ICD-10-CM

## 2015-06-28 MED ORDER — FLUTICASONE PROPIONATE 50 MCG/ACT NA SUSP: 2.0000 | Freq: Every day | NASAL | Status: DC

## 2015-06-28 MED ORDER — AMOXICILLIN-POT CLAVULANATE 875-125 MG PO TABS: 1.0000 | ORAL_TABLET | Freq: Two times a day (BID) | ORAL | Status: DC

## 2015-06-28 NOTE — Progress Notes (Signed)
Patient ID: Melissa Crawford, female   DOB: April 13, 1976, 40 y.o.   MRN: [id number]   Subjective:    Patient ID: Melissa Crawford    DOB: 03/11/1976, 40 y.o.    MRN: [id number]  HPI  Nasal congestion: Pt presents to acute OV with a 2 weeks h/o cold like symptoms. Pt states her symptoms have now started to increase over the weekend and she endorses increase mucous production, headaches, nasal congestion, rhinorrhea, sinus pressure, feels like she is wheezing. Denies fever, chills, nausea, vomit, diarrhea, rash, sore throat or myalgias. Pt is Eating and drinking well.  OTC: alk cold/sinus, saline nasal spray.  No h/o asthma. Flu immunization UTD.  Past Medical History  Diagnosis Date  . Rosacea   . IBS (irritable bowel syndrome)-Constipation Predominant 12/31/2012   No Known Allergies Past Surgical History  Procedure Laterality Date  . Cesarean section  09/05/2009   Social History  Substance Use Topics  . Smoking status: Never Smoker   . Smokeless tobacco: Never Used  . Alcohol Use: No    Review of Systems Negative, with the exception of above mentioned in HPI     Objective:   Physical Exam BP 122/80 mmHg  Pulse 75  Temp(Src) 98 F (36.7 C)  Resp 20  Wt 140 lb 12 oz (63.844 kg)  SpO2 96%  LMP 06/13/2015 Body mass index is 22.73 kg/(m^2). Gen: Afebrile. No acute distress. Nontoxic in appearance. Well developed, well nourished, caucasian female. Pleasant.  HENT: AT. [place]. Bilateral TM visualized, left TM with fluid level/ otherwise no erythema or bulging. MMM, no oral lesions. Bilateral nares with mild erythema, no swelling or leasions. Throat mild erythema, No exudates. Cough notoresent, Hoarseness mild, TTP left max sinus Eyes:Pupils Equal Round Reactive to light, Extraocular movements intact,  Conjunctiva without redness, discharge or icterus. Neck/lymp/endocrine: Supple,No  lymphadenopathy CV: RRR  Chest: CTAB, no wheeze or crackles. Good air movement. Normal resp effort.  Abd:  Soft. NTND. BS present Skin: No rashes, purpura or petechiae.    Assessment & Plan:  Melissa Crawford is a 40 y.o. present for acute OV  1. Acute maxillary sinusitis, recurrence not specified - amoxicillin-clavulanate (AUGMENTIN) 875-125 MG tablet; Take 1 tablet by mouth 2 (two) times daily.  Dispense: 20 tablet; Refill: 0 - fluticasone (FLONASE) 50 MCG/ACT nasal spray; Place 2 sprays into both nostrils daily.  Dispense: 16 g; Refill: 6 - Augmentin BID x 10 days - Flonase  - Humidifier, Mucinex/robitussin - F/U PRN

## 2015-06-28 NOTE — Patient Instructions (Addendum)
Sinusitis, Adult Sinusitis is redness, soreness, and inflammation of the paranasal sinuses. Paranasal sinuses are air pockets within the bones of your face. They are located beneath your eyes, in the middle of your forehead, and above your eyes. In healthy paranasal sinuses, mucus is able to drain out, and air is able to circulate through them by way of your nose. However, when your paranasal sinuses are inflamed, mucus and air can become trapped. This can allow bacteria and other germs to grow and cause infection. Sinusitis can develop quickly and last only a short time (acute) or continue over a long period (chronic). Sinusitis that lasts for more than 12 weeks is considered chronic. CAUSES Causes of sinusitis include:  Allergies.  Structural abnormalities, such as displacement of the cartilage that separates your nostrils (deviated septum), which can decrease the air flow through your nose and sinuses and affect sinus drainage.  Functional abnormalities, such as when the small hairs (cilia) that line your sinuses and help remove mucus do not work properly or are not present. SIGNS AND SYMPTOMS Symptoms of acute and chronic sinusitis are the same. The primary symptoms are pain and pressure around the affected sinuses. Other symptoms include:  Upper toothache.  Earache.  Headache.  Bad breath.  Decreased sense of smell and taste.  A cough, which worsens when you are lying flat.  Fatigue.  Fever.  Thick drainage from your nose, which often is green and may contain pus (purulent).  Swelling and warmth over the affected sinuses. DIAGNOSIS Your health care provider will perform a physical exam. During your exam, your health care provider may perform any of the following to help determine if you have acute sinusitis or chronic sinusitis:  Look in your nose for signs of abnormal growths in your nostrils (nasal polyps).  Tap over the affected sinus to check for signs of  infection.  View the inside of your sinuses using an imaging device that has a light attached (endoscope). If your health care provider suspects that you have chronic sinusitis, one or more of the following tests may be recommended:  Allergy tests.  Nasal culture. A sample of mucus is taken from your nose, sent to a lab, and screened for bacteria.  Nasal cytology. A sample of mucus is taken from your nose and examined by your health care provider to determine if your sinusitis is related to an allergy. TREATMENT Most cases of acute sinusitis are related to a viral infection and will resolve on their own within 10 days. Sometimes, medicines are prescribed to help relieve symptoms of both acute and chronic sinusitis. These may include pain medicines, decongestants, nasal steroid sprays, or saline sprays. However, for sinusitis related to a bacterial infection, your health care provider will prescribe antibiotic medicines. These are medicines that will help kill the bacteria causing the infection. Rarely, sinusitis is caused by a fungal infection. In these cases, your health care provider will prescribe antifungal medicine. For some cases of chronic sinusitis, surgery is needed. Generally, these are cases in which sinusitis recurs more than 3 times per year, despite other treatments. HOME CARE INSTRUCTIONS  Drink plenty of water. Water helps thin the mucus so your sinuses can drain more easily.  Use a humidifier.  Inhale steam 3-4 times a day (for example, sit in the bathroom with the shower running).  Apply a warm, moist washcloth to your face 3-4 times a day, or as directed by your health care provider.  Use saline nasal sprays to help   moisten and clean your sinuses.  Take medicines only as directed by your health care provider.  If you were prescribed either an antibiotic or antifungal medicine, finish it all even if you start to feel better. SEEK IMMEDIATE MEDICAL CARE IF:  You have  increasing pain or severe headaches.  You have nausea, vomiting, or drowsiness.  You have swelling around your face.  You have vision problems.  You have a stiff neck.  You have difficulty breathing.   This information is not intended to replace advice given to you by your health care provider. Make sure you discuss any questions you have with your health care provider.   Document Released: 05/22/2005 Document Revised: 06/12/2014 Document Reviewed: 06/06/2011 Elsevier Interactive Patient Education 2016 Elsevier Inc.  - Augmentin x 10 days - Flonase at least 2-4 weeks - Humidifier, Mucinex/robitussin - F/U as needed

## 2015-07-05 ENCOUNTER — Encounter: Payer: Self-pay | Admitting: Family Medicine

## 2015-07-06 ENCOUNTER — Encounter: Payer: Self-pay | Admitting: Family Medicine

## 2015-07-06 ENCOUNTER — Ambulatory Visit: Payer: Managed Care, Other (non HMO) | Admitting: Internal Medicine

## 2015-07-06 ENCOUNTER — Ambulatory Visit (INDEPENDENT_AMBULATORY_CARE_PROVIDER_SITE_OTHER): Payer: Managed Care, Other (non HMO) | Admitting: Family Medicine

## 2015-07-06 VITALS — BP 124/81 | HR 62 | Temp 97.4°F | Resp 20 | Wt 138.5 lb

## 2015-07-06 DIAGNOSIS — J0100 Acute maxillary sinusitis, unspecified: Secondary | ICD-10-CM | POA: Diagnosis not present

## 2015-07-06 DIAGNOSIS — T7840XA Allergy, unspecified, initial encounter: Secondary | ICD-10-CM | POA: Diagnosis not present

## 2015-07-06 DIAGNOSIS — B379 Candidiasis, unspecified: Secondary | ICD-10-CM

## 2015-07-06 MED ORDER — FLUCONAZOLE 150 MG PO TABS: 150.0000 mg | ORAL_TABLET | Freq: Once | ORAL | Status: DC

## 2015-07-06 NOTE — Progress Notes (Signed)
Patient ID: Melissa Crawford, female   DOB: 1976/04/14, 40 y.o.   MRN: [id number]   Subjective:    Patient ID: Melissa Crawford    DOB: 1976-03-24, 39 y.o.    MRN: [id number]  HPI  Skin rash: Pt presents with 4 day h/o skin rash that started on day 5 of her Augmentin use, affecting her hands, feet, LE and forearms. Patient reports the rash is pruritic in nature. She denies any changes in soaps, perfumes, detergents etc. She has not taken anything for the reaction. She has stopped the antibiotic as instructed. She does have similar reactions with changes in soaps in the past. She states the rash is improving since stopping abx. She denies any shortness of breath or facial rash.   Yeast infection: Pt states she is starting to get a yeast infection after Augmentin use. She endorsees vaginal irritaiton and increase in discharge. She has tried nothing oTC for her symptoms.   Maxillary sinusitis: She reports resolution of her symptoms of her sinus infection. She has taken 8 days of abx, prior to above reaction.    Past Medical History  Diagnosis Date  . Rosacea   . 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 Augmentin [Amoxicillin-Pot Clavulanate] Rash    Pt developed rash on day 7 of therapy   Past Surgical History  Procedure Laterality Date  . Cesarean section  09/05/2009   Social History  Substance Use Topics  . Smoking status: Never Smoker   . Smokeless tobacco: Never Used  . Alcohol Use: No    Review of Systems Negative, with the exception of above mentioned in HPI     Objective:   Physical Exam BP 124/81 mmHg  Pulse 62  Temp(Src) 97.4 F (36.3 C) (Oral)  Resp 20  Wt 138 lb 8 oz (62.823 kg)  SpO2 100%  LMP 06/13/2015 Body mass index is 22.37 kg/(m^2). Gen: Afebrile. No acute distress. Nontoxic in appearance. Well developed, well nourished, caucasian female. Pleasant.  HENT: AT. [place].  MMM, no oral lesions.  Eyes:Pupils  Equal Round Reactive to light, Extraocular movements intact,  Conjunctiva without redness, discharge or icterus. Skin: No  purpura or petechiae.very fine rash erythremic hands and right forearm. Hands dry.    Assessment & Plan:  Melissa Crawford is a 40 y.o. present for acute OV  1. Allergic reaction, initial encounter - allegra/benadryl if needed - hydrocortisone cream if needed.  - No alarm systems/signs, very mild reaction. Discussed alarm symptoms with pt and when to seek emergent treatment.   2. Acute maxillary sinusitis, recurrence not specified - resolved. Would not prescribe second abx at this time.   3. Yeast infection - new - Caused by abx use.  - fluconazole (DIFLUCAN) 150 MG tablet; Take 1 tablet (150 mg total) by mouth once.  Dispense: 1 tablet; Refill: 0  - F/U as needed

## 2015-07-06 NOTE — Patient Instructions (Signed)
1. Allergic reaction, initial encounter - allegra/benadryl if needed - hydrocortisone cream if needed.   2. Acute maxillary sinusitis, recurrence not specified - resolved. Would not prescribe second abx at this time.   3. Yeast infection - Caused by abx use.  - fluconazole (DIFLUCAN) 150 MG tablet; Take 1 tablet (150 mg total) by mouth once.  Dispense: 1 tablet; Refill: 0

## 2015-12-08 ENCOUNTER — Other Ambulatory Visit: Payer: Self-pay | Admitting: Internal Medicine

## 2015-12-08 ENCOUNTER — Encounter: Payer: Self-pay | Admitting: Internal Medicine

## 2015-12-08 DIAGNOSIS — N3001 Acute cystitis with hematuria: Secondary | ICD-10-CM

## 2015-12-08 DIAGNOSIS — B379 Candidiasis, unspecified: Secondary | ICD-10-CM

## 2015-12-08 MED ORDER — SULFAMETHOXAZOLE-TRIMETHOPRIM 800-160 MG PO TABS: 1.0000 | ORAL_TABLET | Freq: Two times a day (BID) | ORAL | Status: AC

## 2015-12-08 MED ORDER — FLUCONAZOLE 150 MG PO TABS: 150.0000 mg | ORAL_TABLET | Freq: Once | ORAL | Status: DC

## 2016-02-24 ENCOUNTER — Telehealth: Payer: Self-pay | Admitting: Internal Medicine

## 2016-02-24 ENCOUNTER — Ambulatory Visit (INDEPENDENT_AMBULATORY_CARE_PROVIDER_SITE_OTHER): Payer: Managed Care, Other (non HMO) | Admitting: Family

## 2016-02-24 ENCOUNTER — Encounter: Payer: Self-pay | Admitting: Family

## 2016-02-24 VITALS — BP 110/80 | HR 72 | Temp 98.3°F | Resp 16 | Ht 66.0 in | Wt 141.0 lb

## 2016-02-24 DIAGNOSIS — J029 Acute pharyngitis, unspecified: Secondary | ICD-10-CM

## 2016-02-24 DIAGNOSIS — J020 Streptococcal pharyngitis: Secondary | ICD-10-CM | POA: Insufficient documentation

## 2016-02-24 LAB — POCT RAPID STREP A (OFFICE): Rapid Strep A Screen: NEGATIVE

## 2016-02-24 MED ORDER — AZITHROMYCIN 250 MG PO TABS: ORAL_TABLET | ORAL | 0 refills | Status: DC

## 2016-02-24 NOTE — Telephone Encounter (Signed)
PLEASE NOTE: All timestamps contained within this report are represented as [place] Standard Time. CONFIDENTIALTY NOTICE: This fax transmission is intended only for the addressee. It contains information that is legally privileged, confidential or otherwise protected from use or disclosure. If you are not the intended recipient, you are strictly prohibited from reviewing, disclosing, copying using or disseminating any of this information or taking any action in reliance on or regarding this information. If you have received this fax in error, please notify us immediately by telephone so that we can arrange for its return to us. Phone: [phone], Toll-Free: [phone], Fax: [phone] Page: 1 of 1 Call Id: [id number] [facility] - Client TELEPHONE ADVICE RECORD [facility] Patient Name: Melissa Crawford DOB: [birth year] Initial Comment Thinks she has strep Nurse Assessment Nurse: Debera Latalston, RN, Tinnie GensJeffrey Date/Time Lamount Cohen(Eastern Time): 02/24/2016 8:45:37 AM Confirm and document reason for call. If symptomatic, describe symptoms. You must click the next button to save text entered. ---Thinks she has strep. Symptoms started on Tuesday. Highest fever of 100.3. Inflammed red throat. Has the patient traveled out of the country within the last 30 days? ---No Does the patient have any new or worsening symptoms? ---Yes Will a triage be completed? ---Yes Related visit to physician within the last 2 weeks? ---N/A Does the PT have any chronic conditions? (i.e. diabetes, asthma, etc.) ---No Is the patient pregnant or possibly pregnant? (Ask all females between the ages of 4512-55) ---No Is this a behavioral health or substance abuse call? ---No Guidelines Guideline Title Affirmed Question Affirmed Notes Sore Throat [1] Pus on tonsils (back of throat) AND [2] fever AND [3] swollen neck lymph nodes ("glands") Final Disposition User See Physician within 24 Hours  Debera Latalston, RN, Abbott LaboratoriesJeffrey Referrals REFERRED TO PCP OFFICE Disagree/Comply: Danella Maiersomply

## 2016-02-24 NOTE — Progress Notes (Signed)
Subjective:    Patient ID: Melissa Crawford, female    DOB: 1975-09-30, 40 y.o.   MRN: [id number]  Chief Complaint  Patient presents with  . Sore Throat    fever and sore throat, x2 days    HPI:  Melissa RutterMeredith Pope is a 40 y.o. female who  has a past medical history of IBS (irritable bowel syndrome)-Constipation Predominant (12/31/2012) and Rosacea. and presents today for an an acute office visit.  This is a new problem. Associated symptom of sore throat has been going on for about 2 days. Did have a low grade fever of 100.3. NoOther cold/flu like symptoms. No recent antibiotics. Notes the course of symptoms have improved gradually since this morning.  Allergies  Allergen Reactions  . Augmentin [Amoxicillin-Pot Clavulanate] Rash    Pt developed rash on day 7 of therapy      Outpatient Medications Prior to Visit  Medication Sig Dispense Refill  . LINZESS 145 MCG CAPS capsule TAKE 1 CAPSULE BY MOUTH DAILY 90 capsule 3  . fluconazole (DIFLUCAN) 150 MG tablet Take 1 tablet (150 mg total) by mouth once. 1 tablet 2  . fluticasone (FLONASE) 50 MCG/ACT nasal spray Place 2 sprays into both nostrils daily. 16 g 6   No facility-administered medications prior to visit.     Review of Systems  Constitutional: Negative for chills and fever.  HENT: Positive for sore throat. Negative for congestion.   Respiratory: Negative for cough, chest tightness and shortness of breath.       Objective:    BP 110/80 (BP Location: Left Arm, Patient Position: Sitting, Cuff Size: Normal)   Pulse 72   Temp 98.3 F (36.8 C) (Oral)   Resp 16   Ht 5\' 6"  (1.676 m)   Wt 141 lb (64 kg)   SpO2 98%   BMI 22.76 kg/m  Nursing note and vital signs reviewed.  Physical Exam  Constitutional: She is oriented to person, place, and time. She appears well-developed and well-nourished. No distress.  HENT:  Right Ear: Hearing, tympanic membrane, external ear and ear canal normal.  Left Ear: Hearing, tympanic membrane,  external ear and ear canal normal.  Nose: Nose normal.  Mouth/Throat: Uvula is midline and mucous membranes are normal. Posterior oropharyngeal edema and posterior oropharyngeal erythema present. No oropharyngeal exudate or tonsillar abscesses.  Cardiovascular: Normal rate, regular rhythm, normal heart sounds and intact distal pulses.   Pulmonary/Chest: Effort normal and breath sounds normal.  Neurological: She is alert and oriented to person, place, and time.  Skin: Skin is warm and dry.  Psychiatric: She has a normal mood and affect. Her behavior is normal. Judgment and thought content normal.       Assessment & Plan:   Problem List Items Addressed This Visit      Other   Sore throat - Primary    Symptoms and exam consistent with pharyngitis most likely viral given most recent improvements. In office strep test negative. Continue conservative treatment with over-the-counter medications as needed for symptom relief and supportive care. Written prescription for azithromycin provided if symptoms worsen with instructions for watchful waiting. Follow-up if symptoms worsen or do not improve.      Relevant Medications   azithromycin (ZITHROMAX) 250 MG tablet   Other Relevant Orders   POCT rapid strep A (Completed)    Other Visit Diagnoses   None.      I have discontinued Ms. Puello's fluticasone and fluconazole. I am also having her start on azithromycin. Additionally,  I am having her maintain her LINZESS.   Meds ordered this encounter  Medications  . azithromycin (ZITHROMAX) 250 MG tablet    Sig: Take 2 tablets by mouth daily for 1 day and then 1 tablet by mouth daily for 4 days.    Dispense:  6 tablet    Refill:  0    Order Specific Question:   Supervising Provider    Answer:   Hillard Danker A [[id number]]     Follow-up: Return if symptoms worsen or fail to improve.  Jeanine Luz, FNP

## 2016-02-24 NOTE — Assessment & Plan Note (Signed)
Symptoms and exam consistent with pharyngitis most likely viral given most recent improvements. In office strep test negative. Continue conservative treatment with over-the-counter medications as needed for symptom relief and supportive care. Written prescription for azithromycin provided if symptoms worsen with instructions for watchful waiting. Follow-up if symptoms worsen or do not improve.

## 2016-02-24 NOTE — Patient Instructions (Signed)
Thank you for choosing [facility].  SUMMARY AND INSTRUCTIONS:  Medication:  Your prescription(s) have been submitted to your pharmacy or been printed and provided for you. Please take as directed and contact our office if you believe you are having problem(s) with the medication(s) or have any questions.   Follow up:  If your symptoms worsen or fail to improve, please contact our office for further instruction, or in case of emergency go directly to the emergency room at the closest medical facility.    Pharyngitis Pharyngitis is redness, pain, and swelling (inflammation) of your pharynx.  CAUSES  Pharyngitis is usually caused by infection. Most of the time, these infections are from viruses (viral) and are part of a cold. However, sometimes pharyngitis is caused by bacteria (bacterial). Pharyngitis can also be caused by allergies. Viral pharyngitis may be spread from person to person by coughing, sneezing, and personal items or utensils (cups, forks, spoons, toothbrushes). Bacterial pharyngitis may be spread from person to person by more intimate contact, such as kissing.  SIGNS AND SYMPTOMS  Symptoms of pharyngitis include:   Sore throat.   Tiredness (fatigue).   Low-grade fever.   Headache.  Joint pain and muscle aches.  Skin rashes.  Swollen lymph nodes.  Plaque-like film on throat or tonsils (often seen with bacterial pharyngitis). DIAGNOSIS  Your health care provider will ask you questions about your illness and your symptoms. Your medical history, along with a physical exam, is often all that is needed to diagnose pharyngitis. Sometimes, a rapid strep test is done. Other lab tests may also be done, depending on the suspected cause.  TREATMENT  Viral pharyngitis will usually get better in 3-4 days without the use of medicine. Bacterial pharyngitis is treated with medicines that kill germs (antibiotics).  HOME CARE INSTRUCTIONS   Drink enough water and  fluids to keep your urine clear or pale yellow.   Only take over-the-counter or prescription medicines as directed by your health care provider:   If you are prescribed antibiotics, make sure you finish them even if you start to feel better.   Do not take aspirin.   Get lots of rest.   Gargle with 8 oz of salt water ( tsp of salt per 1 qt of water) as often as every 1-2 hours to soothe your throat.   Throat lozenges (if you are not at risk for choking) or sprays may be used to soothe your throat. SEEK MEDICAL CARE IF:   You have large, tender lumps in your neck.  You have a rash.  You cough up green, yellow-brown, or bloody spit. SEEK IMMEDIATE MEDICAL CARE IF:   Your neck becomes stiff.  You drool or are unable to swallow liquids.  You vomit or are unable to keep medicines or liquids down.  You have severe pain that does not go away with the use of recommended medicines.  You have trouble breathing (not caused by a stuffy nose). MAKE SURE YOU:   Understand these instructions.  Will watch your condition.  Will get help right away if you are not doing well or get worse.   This information is not intended to replace advice given to you by your health care provider. Make sure you discuss any questions you have with your health care provider.   Document Released: 05/22/2005 Document Revised: 03/12/2013 Document Reviewed: 01/27/2013 Elsevier Interactive Patient Education 2016 Elsevier Inc.  General Recommendations:    Please drink plenty of fluids.  Get plenty of rest  Sleep in humidified air  Use saline nasal sprays  Netti pot   OTC Medications:  Decongestants - helps relieve congestion   Flonase (generic fluticasone) or Nasacort (generic triamcinolone) - please make sure to use the "cross-over" technique at a 45 degree angle towards the opposite eye as opposed to straight up the nasal passageway.   Sudafed (generic pseudoephedrine - Note this is  the one that is available behind the pharmacy counter); Products with phenylephrine (-PE) may also be used but is often not as effective as pseudoephedrine.   If you have HIGH BLOOD PRESSURE - Coricidin HBP; AVOID any product that is -D as this contains pseudoephedrine which may increase your blood pressure.  Afrin (oxymetazoline) every 6-8 hours for up to 3 days.   Allergies - helps relieve runny nose, itchy eyes and sneezing   Claritin (generic loratidine), Allegra (fexofenidine), or Zyrtec (generic cyrterizine) for runny nose. These medications should not cause drowsiness.  Note - Benadryl (generic diphenhydramine) may be used however may cause drowsiness  Cough -   Delsym or Robitussin (generic dextromethorphan)  Expectorants - helps loosen mucus to ease removal   Mucinex (generic guaifenesin) as directed on the package.  Headaches / General Aches   Tylenol (generic acetaminophen) - DO NOT EXCEED 3 grams (3,000 mg) in a 24 hour time period  Advil/Motrin (generic ibuprofen)   Sore Throat -   Salt water gargle   Chloraseptic (generic benzocaine) spray or lozenges / Sucrets (generic dyclonine)

## 2016-03-30 ENCOUNTER — Other Ambulatory Visit: Payer: Self-pay | Admitting: Internal Medicine

## 2016-03-30 ENCOUNTER — Encounter: Payer: Self-pay | Admitting: Internal Medicine

## 2016-03-30 MED ORDER — LINACLOTIDE 145 MCG PO CAPS: 145.0000 ug | ORAL_CAPSULE | Freq: Every day | ORAL | 3 refills | Status: DC

## 2016-08-21 ENCOUNTER — Encounter: Payer: Self-pay | Admitting: Internal Medicine

## 2016-08-21 ENCOUNTER — Ambulatory Visit (INDEPENDENT_AMBULATORY_CARE_PROVIDER_SITE_OTHER): Payer: Commercial Managed Care - PPO | Admitting: Internal Medicine

## 2016-08-21 VITALS — BP 120/70 | HR 68 | Temp 98.6°F | Resp 16 | Ht 66.0 in | Wt 134.5 lb

## 2016-08-21 DIAGNOSIS — J020 Streptococcal pharyngitis: Secondary | ICD-10-CM

## 2016-08-21 MED ORDER — AZITHROMYCIN 500 MG PO TABS: 500.0000 mg | ORAL_TABLET | Freq: Every day | ORAL | 0 refills | Status: DC

## 2016-08-21 NOTE — Progress Notes (Signed)
Subjective:  Patient ID: Melissa Crawford, female    DOB: 1976-02-02  Age: 41 y.o. MRN: [id number]  CC: Sore Throat   HPI Melissa RutterMeredith Mezera presents for A one-week history of sore throat, lymphadenopathy in her neck, chills, and low-grade fever.  Outpatient Medications Prior to Visit  Medication Sig Dispense Refill  . linaclotide (LINZESS) 145 MCG CAPS capsule Take 1 capsule (145 mcg total) by mouth daily. 90 capsule 3   No facility-administered medications prior to visit.     ROS Review of Systems  Constitutional: Positive for chills and fever. Negative for activity change, appetite change, diaphoresis, fatigue and unexpected weight change.  HENT: Positive for sore throat. Negative for facial swelling, sinus pressure, trouble swallowing and voice change.   Eyes: Negative for visual disturbance.  Respiratory: Negative for cough, chest tightness and shortness of breath.   Cardiovascular: Negative for chest pain, palpitations and leg swelling.  Gastrointestinal: Negative for abdominal pain, constipation, diarrhea, nausea and vomiting.  Endocrine: Negative.   Genitourinary: Negative.   Musculoskeletal: Negative.  Negative for back pain, myalgias and neck pain.  Skin: Negative.  Negative for color change, pallor and rash.  Allergic/Immunologic: Negative.   Neurological: Negative.  Negative for dizziness and weakness.  Hematological: Positive for adenopathy. Does not bruise/bleed easily.  Psychiatric/Behavioral: Negative.     Objective:  BP 120/70 (BP Location: Left Arm, Patient Position: Sitting, Cuff Size: Normal)   Pulse 68   Temp 98.6 F (37 C) (Oral)   Resp 16   Ht 5\' 6"  (1.676 m)   Wt 134 lb 8 oz (61 kg)   LMP 08/18/2016   SpO2 98%   BMI 21.71 kg/m   BP Readings from Last 3 Encounters:  08/21/16 120/70  02/24/16 110/80  07/06/15 124/81    Wt Readings from Last 3 Encounters:  08/21/16 134 lb 8 oz (61 kg)  02/24/16 141 lb (64 kg)  07/06/15 138 lb 8 oz (62.8 kg)     Physical Exam  Constitutional: She is oriented to person, place, and time.  Non-toxic appearance. She does not have a sickly appearance. She does not appear ill. No distress.  HENT:  Mouth/Throat: Mucous membranes are normal. Mucous membranes are not pale, not dry and not cyanotic. Posterior oropharyngeal erythema present. No oropharyngeal exudate, posterior oropharyngeal edema or tonsillar abscesses.  Eyes: Conjunctivae are normal. Right eye exhibits no discharge. Left eye exhibits no discharge. No scleral icterus.  Neck: Normal range of motion. Neck supple. No JVD present. No tracheal deviation present. No thyromegaly present.  Cardiovascular: Normal rate, regular rhythm, normal heart sounds and intact distal pulses.  Exam reveals no gallop and no friction rub.   No murmur heard. Pulmonary/Chest: Effort normal and breath sounds normal. No stridor. No respiratory distress. She has no wheezes. She has no rales. She exhibits no tenderness.  Abdominal: Soft. Bowel sounds are normal. She exhibits no distension and no mass. There is no tenderness. There is no rebound and no guarding.  Musculoskeletal: Normal range of motion. She exhibits no edema, tenderness or deformity.  Lymphadenopathy:       Head (right side): No submental, no submandibular, no tonsillar, no preauricular, no posterior auricular and no occipital adenopathy present.       Head (left side): No submental, no submandibular, no tonsillar, no preauricular, no posterior auricular and no occipital adenopathy present.    She has cervical adenopathy.       Right cervical: Superficial cervical adenopathy present. No deep cervical and no posterior  cervical adenopathy present.      Left cervical: Superficial cervical adenopathy present. No deep cervical and no posterior cervical adenopathy present.    She has no axillary adenopathy.       Right: No supraclavicular adenopathy present.       Left: No supraclavicular adenopathy present.   Neurological: She is oriented to person, place, and time.  Skin: Skin is warm and dry. No rash noted. She is not diaphoretic. No erythema. No pallor.  Vitals reviewed.   Lab Results  Component Value Date   WBC 5.3 10/28/2014   HGB 13.2 10/28/2014   HCT 38.9 10/28/2014   PLT 259.0 10/28/2014   GLUCOSE 86 10/28/2014   CHOL 163 10/28/2014   TRIG 59.0 10/28/2014   HDL 74.40 10/28/2014   LDLCALC 77 10/28/2014   ALT 12 10/28/2014   AST 15 10/28/2014   NA 137 10/28/2014   K 4.0 10/28/2014   CL 104 10/28/2014   CREATININE 0.67 10/28/2014   BUN 8 10/28/2014   CO2 27 10/28/2014   TSH 1.36 10/28/2014    Mr Breast Bilateral W Wo Contrast  Result Date: 02/26/2013 *RADIOLOGY REPORT* Clinical Data:Family history of breast cancer in the patient's mother at the age of 90.  Dense fibroglandular tissue. BILATERAL BREAST MRI WITH AND WITHOUT CONTRAST Technique: Multiplanar, multisequence MR images of both breasts were obtained prior to and following the intravenous administration of 12ml of multihance. THREE-DIMENSIONAL MR IMAGE RENDERING ON INDEPENDENT WORKSTATION: Three-dimensional MR images were rendered by post-processing  of the original MR data on an independent DynaCad workstation. The three-dimensional MR images were interpreted, and findings are reported in the following complete MRI report for this study. Comparison:  Recent imaging examinations. FINDINGS: Breast composition:  c:  Heterogeneous fibroglandular tissue Background parenchymal enhancement: Moderate. Right breast:  No mass or abnormal enhancement. Left breast:  No mass or abnormal enhancement. Lymph nodes:  No abnormal appearing lymph nodes. Ancillary findings:  None. IMPRESSION: Negative breast MRI. RECOMMENDATION: If the clinical exam remains benign/stable screening mammography can be deferred until the age of 50. BI-RADS CATEGORY 1:  Negative. Original Report Authenticated By: Baird Lyons, M.D.    Assessment & Plan:   Diamonds  was seen today for sore throat.  Diagnoses and all orders for this visit:  Strep throat -     azithromycin (ZITHROMAX) 500 MG tablet; Take 1 tablet (500 mg total) by mouth daily.   I am having Ms. Tep start on azithromycin. I am also having her maintain her linaclotide.  Meds ordered this encounter  Medications  . azithromycin (ZITHROMAX) 500 MG tablet    Sig: Take 1 tablet (500 mg total) by mouth daily.    Dispense:  3 tablet    Refill:  0     Follow-up: No Follow-up on file.  Sanda Linger, MD

## 2016-08-21 NOTE — Progress Notes (Signed)
Pre visit review using our clinic review tool, if applicable. No additional management support is needed unless otherwise documented below in the visit note. 

## 2016-08-22 NOTE — Patient Instructions (Signed)
Sore Throat A sore throat is pain, burning, irritation, or scratchiness in the throat. When you have a sore throat, you may feel pain or tenderness in your throat when you swallow or talk. Many things can cause a sore throat, including:  An infection.  Seasonal allergies.  Dryness in the air.  Irritants, such as smoke or pollution.  Gastroesophageal reflux disease (GERD).  A tumor. A sore throat is often the first sign of another sickness. It may happen with other symptoms, such as coughing, sneezing, fever, and swollen neck glands. Most sore throats go away without medical treatment. Follow these instructions at home:  Take over-the-counter medicines only as told by your health care provider.  Drink enough fluids to keep your urine clear or pale yellow.  Rest as needed.  To help with pain, try:  Sipping warm liquids, such as broth, herbal tea, or warm water.  Eating or drinking cold or frozen liquids, such as frozen ice pops.  Gargling with a salt-water mixture 3-4 times a day or as needed. To make a salt-water mixture, completely dissolve -1 tsp of salt in 1 cup of warm water.  Sucking on hard candy or throat lozenges.  Putting a cool-mist humidifier in your bedroom at night to moisten the air.  Sitting in the bathroom with the door closed for 5-10 minutes while you run hot water in the shower.  Do not use any tobacco products, such as cigarettes, chewing tobacco, and e-cigarettes. If you need help quitting, ask your health care provider. Contact a health care provider if:  You have a fever for more than 2-3 days.  You have symptoms that last (are persistent) for more than 2-3 days.  Your throat does not get better within 7 days.  You have a fever and your symptoms suddenly get worse. Get help right away if:  You have difficulty breathing.  You cannot swallow fluids, soft foods, or your saliva.  You have increased swelling in your throat or neck.  You have  persistent nausea and vomiting. This information is not intended to replace advice given to you by your health care provider. Make sure you discuss any questions you have with your health care provider. Document Released: 06/29/2004 Document Revised: 01/16/2016 Document Reviewed: 03/12/2015 Elsevier Interactive Patient Education  2017 [employer]  

## 2016-09-17 ENCOUNTER — Encounter: Payer: Self-pay | Admitting: Internal Medicine

## 2016-09-18 ENCOUNTER — Other Ambulatory Visit: Payer: Self-pay | Admitting: Internal Medicine

## 2016-09-18 DIAGNOSIS — K581 Irritable bowel syndrome with constipation: Secondary | ICD-10-CM

## 2016-09-18 DIAGNOSIS — K5904 Chronic idiopathic constipation: Secondary | ICD-10-CM

## 2016-09-18 MED ORDER — LINACLOTIDE 145 MCG PO CAPS: 145.0000 ug | ORAL_CAPSULE | Freq: Every day | ORAL | 3 refills | Status: DC

## 2016-10-20 ENCOUNTER — Encounter: Payer: Self-pay | Admitting: Internal Medicine

## 2016-10-20 MED ORDER — FLUCONAZOLE 150 MG PO TABS: ORAL_TABLET | ORAL | 1 refills | Status: DC

## 2016-10-20 NOTE — Telephone Encounter (Signed)
See above

## 2017-10-02 ENCOUNTER — Other Ambulatory Visit: Payer: Self-pay | Admitting: Internal Medicine

## 2017-10-02 DIAGNOSIS — K581 Irritable bowel syndrome with constipation: Secondary | ICD-10-CM

## 2017-10-02 DIAGNOSIS — K5904 Chronic idiopathic constipation: Secondary | ICD-10-CM

## 2018-04-23 ENCOUNTER — Other Ambulatory Visit: Payer: Self-pay | Admitting: Obstetrics and Gynecology

## 2018-04-23 DIAGNOSIS — Z803 Family history of malignant neoplasm of breast: Secondary | ICD-10-CM

## 2019-03-22 ENCOUNTER — Ambulatory Visit (HOSPITAL_COMMUNITY)
Admission: EM | Admit: 2019-03-22 | Discharge: 2019-03-22 | Disposition: A | Discharge status: Home / Self Care | Payer: Commercial Managed Care - PPO | Attending: Family Medicine | Admitting: Family Medicine

## 2019-03-22 ENCOUNTER — Encounter (HOSPITAL_COMMUNITY): Payer: Self-pay | Admitting: *Deleted

## 2019-03-22 ENCOUNTER — Other Ambulatory Visit: Payer: Self-pay

## 2019-03-22 DIAGNOSIS — W540XXA Bitten by dog, initial encounter: Secondary | ICD-10-CM

## 2019-03-22 DIAGNOSIS — S0185XA Open bite of other part of head, initial encounter: Secondary | ICD-10-CM | POA: Diagnosis not present

## 2019-03-22 DIAGNOSIS — Z23 Encounter for immunization: Secondary | ICD-10-CM

## 2019-03-22 MED ORDER — TETANUS-DIPHTH-ACELL PERTUSSIS 5-2.5-18.5 LF-MCG/0.5 IM SUSP
0.5000 mL | Freq: Once | INTRAMUSCULAR | Status: AC
  Administered 2019-03-22: 14:00:00 0.5 mL via INTRAMUSCULAR

## 2019-03-22 MED ORDER — CLINDAMYCIN HCL 150 MG PO CAPS: 150.0000 mg | ORAL_CAPSULE | Freq: Three times a day (TID) | ORAL | 0 refills | Status: AC

## 2019-03-22 MED ORDER — TETANUS-DIPHTH-ACELL PERTUSSIS 5-2.5-18.5 LF-MCG/0.5 IM SUSP
INTRAMUSCULAR | Status: AC
  Filled 2019-03-22: qty 0.5

## 2019-03-22 NOTE — ED Provider Notes (Signed)
[facility]    CSN: [id number] Arrival date & time: 03/22/19  1243      History   Chief Complaint Chief Complaint  Patient presents with  . Appointment    1250  . Animal Bite    HPI Melissa Crawford is a 43 y.o. female history of IBS presenting today for evaluation of a dog bite.  Patient states that her new dog accidentally bit her face last night.  She has 2 small puncture wounds to her face as well as right lower lip.  She has had relatively minimal pain with this.  Is unsure when her last tetanus was, upon review appears to be 1997.  Dog was up-to-date on vaccinations and rabies.  Denies difficulty speaking or moving eyes.  Denies difficulty swallowing or any oral swelling.  HPI  Past Medical History:  Diagnosis Date  . 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 Rosacea     Patient Active Problem List   Diagnosis Date Noted  . Strep throat 02/24/2016  . Allergic reaction 07/06/2015  . Depression, acute 10/20/2014  . Constipation 12/31/2012  . 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 Routine general medical examination at a health care facility 12/31/2012    Past Surgical History:  Procedure Laterality Date  . CESAREAN SECTION  09/05/2009  . ENDOMETRIAL ABLATION      OB History   No obstetric history on file.      Home Medications    Prior to Admission medications   Medication Sig Start Date End Date Taking? Authorizing Provider  clindamycin (CLEOCIN) 150 MG capsule Take 1 capsule (150 mg total) by mouth 3 (three) times daily for 7 days. 03/22/19 03/29/19  Wieters, Hallie C, PA-C  LINZESS 145 MCG CAPS capsule TAKE 1 CAPSULE (145 MCG TOTAL) BY MOUTH DAILY. 10/02/17 03/22/19  Janith Lima, MD    Family History Family History  Problem Relation Age of Onset  . Arthritis Other   . Breast cancer Other   . Diabetes Other     Social History Social History   Tobacco Use  . Smoking  status: Never Smoker  . Smokeless tobacco: Never Used  Substance Use Topics  . Alcohol use: Not on file  . Drug use: No     Allergies   Augmentin [amoxicillin-pot clavulanate]   Review of Systems Review of Systems  Constitutional: Negative for fatigue and fever.  HENT: Negative for mouth sores.   Eyes: Negative for visual disturbance.  Respiratory: Negative for shortness of breath.   Cardiovascular: Negative for chest pain.  Gastrointestinal: Negative for abdominal pain, nausea and vomiting.  Genitourinary: Negative for genital sores.  Musculoskeletal: Negative for arthralgias and joint swelling.  Skin: Positive for color change and wound. Negative for rash.  Neurological: Negative for dizziness, weakness, light-headedness and headaches.     Physical Exam Triage Vital Signs ED Triage Vitals  Enc Vitals Group     BP 03/22/19 1307 122/77     Pulse Rate 03/22/19 1307 63     Resp 03/22/19 1307 16     Temp 03/22/19 1307 98.3 F (36.8 C)     Temp Source 03/22/19 1307 Tympanic     SpO2 03/22/19 1307 99 %     Weight --      Height --      Head Circumference --      Peak Flow --      Pain Score 03/22/19 1311 0     Pain Loc --  Pain Edu? --      Excl. in GC? --    No data found.  Updated Vital Signs BP 126/76   Pulse 64   Temp 98.3 F (36.8 C)   Resp 16   SpO2 99%   Visual Acuity Right Eye Distance:   Left Eye Distance:   Bilateral Distance:    Right Eye Near:   Left Eye Near:    Bilateral Near:     Physical Exam Vitals signs and nursing note reviewed.  Constitutional:      Appearance: She is well-developed.     Comments: No acute distress  HENT:     Head: Normocephalic and atraumatic.     Nose: Nose normal.     Mouth/Throat:     Comments: Oral mucosa pink and moist, no tonsillar enlargement or exudate. Posterior pharynx patent and nonerythematous, no uvula deviation or swelling. Normal phonation.  Eyes:     Extraocular Movements: Extraocular  movements intact.     Conjunctiva/sclera: Conjunctivae normal.     Pupils: Pupils are equal, round, and reactive to light.  Neck:     Musculoskeletal: Neck supple.  Cardiovascular:     Rate and Rhythm: Normal rate.  Pulmonary:     Effort: Pulmonary effort is normal. No respiratory distress.  Abdominal:     General: There is no distension.  Musculoskeletal: Normal range of motion.  Skin:    General: Skin is warm and dry.     Comments: 2 small superficial wounds noted to left maxillary area, minimal surrounding erythema, appear to be scabbing, no open areas  Left lower lip with 1 punctate lesion, slightly yellow, no significant swelling  Neurological:     Mental Status: She is alert and oriented to person, place, and time.      UC Treatments / Results  Labs (all labs ordered are listed, but only abnormal results are displayed) Labs Reviewed - No data to display  EKG   Radiology No results found.  Procedures Procedures (including critical care time)  Medications Ordered in UC Medications  Tdap (BOOSTRIX) injection 0.5 mL (has no administration in time range)    Initial Impression / Assessment and Plan / UC Course  I have reviewed the triage vital signs and the nursing notes.  Pertinent labs & imaging results that were available during my care of the patient were reviewed by me and considered in my medical decision making (see chart for details).     Updating tetanus today.  Lesions very superficial, would expect self healing without intervention.  Does not warrant repair.  Has allergy to Augmentin will place on clindamycin for prophylaxis.  Discussed wound care.Discussed strict return precautions. Patient verbalized understanding and is agreeable with plan.  Final Clinical Impressions(s) / UC Diagnoses   Final diagnoses:  Dog bite of face, initial encounter     Discharge Instructions     Tetanus updated Clindamycin every 8 hours for the next week Wash areas  with warm soapy water twice daily  Follow up developing any concerns about wounds or not resolving   ED Prescriptions    Medication Sig Dispense Auth. Provider   clindamycin (CLEOCIN) 150 MG capsule Take 1 capsule (150 mg total) by mouth 3 (three) times daily for 7 days. 21 capsule Wieters, [place] C, PA-C     PDMP not reviewed this encounter.   Lew Dawes, PA-C 03/22/19 1345

## 2019-03-22 NOTE — Discharge Instructions (Signed)
Tetanus updated Clindamycin every 8 hours for the next week Wash areas with warm soapy water twice daily  Follow up developing any concerns about wounds or not resolving

## 2019-03-22 NOTE — ED Triage Notes (Signed)
Pt states her puppy was digging in trash last night; as she intervened, puppy bit her face.  Small superficial puncture wound x2 to left cheek area and one superficial puncture wound to right lower lip.  Denies any pain.  States puppy is up to date on all vaccinations and rabies.

## 2019-03-24 ENCOUNTER — Telehealth: Payer: Self-pay | Admitting: Internal Medicine

## 2019-03-24 NOTE — Telephone Encounter (Signed)
Patient spoke with [facility] on 03/22/19 at 10:21am.  States that dog bite her on the face the night prior causing 3 small puncture wounds - one on the lip and two on the cheek.  States is not bleeding and wanted to get a tetanus shot.   Looks like pt went to [facility].

## 2019-07-07 ENCOUNTER — Other Ambulatory Visit: Payer: Self-pay | Admitting: Obstetrics and Gynecology

## 2019-07-07 DIAGNOSIS — R928 Other abnormal and inconclusive findings on diagnostic imaging of breast: Secondary | ICD-10-CM

## 2019-07-17 ENCOUNTER — Ambulatory Visit
Admission: RE | Admit: 2019-07-17 | Discharge: 2019-07-17 | Disposition: A | Discharge status: Home / Self Care | Payer: Commercial Managed Care - PPO | Source: Ambulatory Visit | Attending: Obstetrics and Gynecology | Admitting: Obstetrics and Gynecology

## 2019-07-17 ENCOUNTER — Other Ambulatory Visit: Payer: Self-pay

## 2019-07-17 ENCOUNTER — Other Ambulatory Visit: Payer: Self-pay | Admitting: Obstetrics and Gynecology

## 2019-07-17 DIAGNOSIS — R928 Other abnormal and inconclusive findings on diagnostic imaging of breast: Secondary | ICD-10-CM

## 2019-07-17 DIAGNOSIS — R921 Mammographic calcification found on diagnostic imaging of breast: Secondary | ICD-10-CM

## 2020-01-19 ENCOUNTER — Other Ambulatory Visit: Payer: Self-pay | Admitting: Obstetrics and Gynecology

## 2020-01-19 ENCOUNTER — Ambulatory Visit
Admission: RE | Admit: 2020-01-19 | Discharge: 2020-01-19 | Disposition: A | Discharge status: Home / Self Care | Payer: Commercial Managed Care - PPO | Source: Ambulatory Visit | Attending: Obstetrics and Gynecology | Admitting: Obstetrics and Gynecology

## 2020-01-19 ENCOUNTER — Other Ambulatory Visit: Payer: Self-pay

## 2020-01-19 DIAGNOSIS — R921 Mammographic calcification found on diagnostic imaging of breast: Secondary | ICD-10-CM

## 2020-05-22 ENCOUNTER — Ambulatory Visit: Payer: Commercial Managed Care - PPO | Attending: Internal Medicine

## 2020-05-22 DIAGNOSIS — Z23 Encounter for immunization: Secondary | ICD-10-CM

## 2020-05-22 NOTE — Progress Notes (Signed)
   Covid-19 Vaccination Clinic  Name:  Melissa Crawford    MRN: [id number] DOB: 12-06-1975  05/22/2020  Melissa Crawford was observed post Covid-19 immunization for 15 minutes without incident. She was provided with Vaccine Information Sheet and instruction to access the V-Safe system.   Melissa Crawford was instructed to call 911 with any severe reactions post vaccine: Marland Kitchen Difficulty breathing  . Swelling of face and throat  . A fast heartbeat  . A bad rash all over body  . Dizziness and weakness   Immunizations Administered    Name Date Dose VIS Date Route   Pfizer COVID-19 Vaccine 05/22/2020 10:06 AM 0.3 mL 03/24/2020 Intramuscular   Manufacturer: [employer], [employer]   Lot: [id number]   NDC: [address]-7

## 2020-07-16 ENCOUNTER — Other Ambulatory Visit: Payer: Self-pay

## 2020-07-16 ENCOUNTER — Ambulatory Visit
Admission: RE | Admit: 2020-07-16 | Discharge: 2020-07-16 | Disposition: A | Discharge status: Home / Self Care | Payer: Commercial Managed Care - PPO | Source: Ambulatory Visit | Attending: Obstetrics and Gynecology | Admitting: Obstetrics and Gynecology

## 2020-07-16 DIAGNOSIS — R921 Mammographic calcification found on diagnostic imaging of breast: Secondary | ICD-10-CM

## 2020-08-17 LAB — HM PAP SMEAR

## 2020-09-28 ENCOUNTER — Encounter: Payer: Self-pay | Admitting: Physician Assistant

## 2020-10-14 ENCOUNTER — Ambulatory Visit: Payer: Commercial Managed Care - PPO | Admitting: Physician Assistant

## 2020-12-29 ENCOUNTER — Ambulatory Visit (INDEPENDENT_AMBULATORY_CARE_PROVIDER_SITE_OTHER): Payer: Commercial Managed Care - PPO | Admitting: Internal Medicine

## 2020-12-29 ENCOUNTER — Encounter: Payer: Self-pay | Admitting: Internal Medicine

## 2020-12-29 ENCOUNTER — Other Ambulatory Visit: Payer: Self-pay

## 2020-12-29 VITALS — BP 114/64 | HR 61 | Temp 98.7°F | Ht 66.0 in | Wt 143.0 lb

## 2020-12-29 DIAGNOSIS — I7300 Raynaud's syndrome without gangrene: Secondary | ICD-10-CM | POA: Insufficient documentation

## 2020-12-29 DIAGNOSIS — Z0000 Encounter for general adult medical examination without abnormal findings: Secondary | ICD-10-CM | POA: Diagnosis not present

## 2020-12-29 DIAGNOSIS — R1084 Generalized abdominal pain: Secondary | ICD-10-CM | POA: Diagnosis not present

## 2020-12-29 DIAGNOSIS — K219 Gastro-esophageal reflux disease without esophagitis: Secondary | ICD-10-CM | POA: Diagnosis not present

## 2020-12-29 DIAGNOSIS — Z0001 Encounter for general adult medical examination with abnormal findings: Secondary | ICD-10-CM

## 2020-12-29 DIAGNOSIS — R140 Abdominal distension (gaseous): Secondary | ICD-10-CM | POA: Diagnosis not present

## 2020-12-29 DIAGNOSIS — K589 Irritable bowel syndrome without diarrhea: Secondary | ICD-10-CM | POA: Insufficient documentation

## 2020-12-29 DIAGNOSIS — Z1159 Encounter for screening for other viral diseases: Secondary | ICD-10-CM | POA: Insufficient documentation

## 2020-12-29 LAB — BASIC METABOLIC PANEL
BUN: 9 mg/dL (ref 6–23)
CO2: 27 mEq/L (ref 19–32)
Calcium: 9.4 mg/dL (ref 8.4–10.5)
Chloride: 104 mEq/L (ref 96–112)
Creatinine, Ser: 0.67 mg/dL (ref 0.40–1.20)
GFR: 107.42 mL/min (ref >60.00)
Glucose, Bld: 83 mg/dL (ref 70–99)
Potassium: 3.9 mEq/L (ref 3.5–5.1)
Sodium: 137 mEq/L (ref 135–145)

## 2020-12-29 LAB — URINALYSIS, ROUTINE W REFLEX MICROSCOPIC
Bilirubin Urine: NEGATIVE
Hgb urine dipstick: NEGATIVE
Ketones, ur: NEGATIVE
Leukocytes,Ua: NEGATIVE
Nitrite: NEGATIVE
RBC / HPF: NONE SEEN (ref 0–?)
Specific Gravity, Urine: 1.01 (ref 1.000–1.030)
Total Protein, Urine: NEGATIVE
Urine Glucose: NEGATIVE
Urobilinogen, UA: 0.2 (ref 0.0–1.0)
pH: 7.5 (ref 5.0–8.0)

## 2020-12-29 LAB — CBC WITH DIFFERENTIAL/PLATELET
Basophils Absolute: 0.1 10*3/uL (ref 0.0–0.1)
Basophils Relative: 1.2 % (ref 0.0–3.0)
Eosinophils Absolute: 0 10*3/uL (ref 0.0–0.7)
Eosinophils Relative: 0.4 % (ref 0.0–5.0)
HCT: 39.8 % (ref 36.0–46.0)
Hemoglobin: 13.4 g/dL (ref 12.0–15.0)
Lymphocytes Relative: 18.2 % (ref 12.0–46.0)
Lymphs Abs: 1 10*3/uL (ref 0.7–4.0)
MCHC: 33.7 g/dL (ref 30.0–36.0)
MCV: 90.9 fl (ref 78.0–100.0)
Monocytes Absolute: 0.4 10*3/uL (ref 0.1–1.0)
Monocytes Relative: 6.8 % (ref 3.0–12.0)
Neutro Abs: 3.8 10*3/uL (ref 1.4–7.7)
Neutrophils Relative %: 73.4 % (ref 43.0–77.0)
Platelets: 197 10*3/uL (ref 150.0–400.0)
RBC: 4.38 Mil/uL (ref 3.87–5.11)
RDW: 13 % (ref 11.5–15.5)
WBC: 5.2 10*3/uL (ref 4.0–10.5)

## 2020-12-29 LAB — C-REACTIVE PROTEIN: CRP: <1 mg/dL (ref 0.5–20.0)

## 2020-12-29 LAB — LIPID PANEL
Cholesterol: 181 mg/dL (ref 0–200)
HDL: 69.5 mg/dL (ref >39.00)
LDL Cholesterol: 94 mg/dL (ref 0–99)
NonHDL: 111.39
Total CHOL/HDL Ratio: 3
Triglycerides: 86 mg/dL (ref 0.0–149.0)
VLDL: 17.2 mg/dL (ref 0.0–40.0)

## 2020-12-29 LAB — HEPATIC FUNCTION PANEL
ALT: 20 U/L (ref 0–35)
AST: 17 U/L (ref 0–37)
Albumin: 4.3 g/dL (ref 3.5–5.2)
Alkaline Phosphatase: 40 U/L (ref 39–117)
Bilirubin, Direct: 0.1 mg/dL (ref 0.0–0.3)
Total Bilirubin: 0.5 mg/dL (ref 0.2–1.2)
Total Protein: 7.3 g/dL (ref 6.0–8.3)

## 2020-12-29 LAB — TSH: TSH: 1 u[IU]/mL (ref 0.35–5.50)

## 2020-12-29 LAB — LIPASE: Lipase: 37 U/L (ref 11.0–59.0)

## 2020-12-29 MED ORDER — HYOSCYAMINE SULFATE 0.125 MG/5ML PO ELIX: 0.1250 mg | ORAL_SOLUTION | Freq: Four times a day (QID) | ORAL | 0 refills | Status: DC | PRN

## 2020-12-29 MED ORDER — ESOMEPRAZOLE MAGNESIUM 40 MG PO CPDR: 40.0000 mg | DELAYED_RELEASE_CAPSULE | Freq: Every day | ORAL | 0 refills | Status: DC

## 2020-12-29 NOTE — Progress Notes (Signed)
Subjective:  Patient ID: Melissa Crawford, female    DOB: 06-22-1975  Age: 45 y.o. MRN: [id number]  CC: Abdominal Pain and Annual Exam  This visit occurred during the SARS-CoV-2 public health emergency.  Safety protocols were in place, including screening questions prior to the visit, additional usage of staff PPE, and extensive cleaning of exam room while observing appropriate contact time as indicated for disinfecting solutions.    HPI Melissa Crawford presents for f/up and CPX.   She complains of a 2-week history of heartburn.  She denies odynophagia or dysphagia.  She also complains of a several year history of diffuse abdominal pain with cramping and bloating.  She has a normal bowel movement each day.  She complains of weight gain.  Her appetite is good.  She has tried dietary changes without any improvement in her symptoms.  She has been treated for IBS-C with no improvement in her symptoms.  She also tells me for several years, worse in the winter than the summer, she has color changes in her fingers and toes.  She tells me she has sun sensitivity but does not currently have a rash.  No outpatient medications prior to visit.   No facility-administered medications prior to visit.    ROS Review of Systems  Constitutional:  Positive for unexpected weight change (wt gain). Negative for appetite change, diaphoresis and fatigue.  HENT: Negative.    Eyes: Negative.   Respiratory:  Negative for cough, chest tightness, shortness of breath and wheezing.   Cardiovascular:  Negative for chest pain, palpitations and leg swelling.  Gastrointestinal:  Positive for abdominal pain. Negative for abdominal distention, blood in stool, constipation, diarrhea, nausea and vomiting.  Endocrine: Negative.   Genitourinary: Negative.  Negative for difficulty urinating, dysuria, hematuria and urgency.  Musculoskeletal: Negative.  Negative for arthralgias, myalgias and neck pain.  Skin:  Positive for color  change.       She describes color change in her digits that is most noticed when the weather is cold.  She describes color changes including red, purple, and white.  Neurological: Negative.   Hematological:  Negative for adenopathy. Does not bruise/bleed easily.  Psychiatric/Behavioral:  Positive for sleep disturbance. Negative for behavioral problems, confusion, decreased concentration, dysphoric mood and suicidal ideas. The patient is nervous/anxious.    Objective:  BP 114/64 (BP Location: Left Arm, Patient Position: Sitting, Cuff Size: Large)   Pulse 61   Temp 98.7 F (37.1 C) (Oral)   Ht 5\' 6"  (1.676 m)   Wt 143 lb (64.9 kg)   SpO2 97%   BMI 23.08 kg/m   BP Readings from Last 3 Encounters:  12/29/20 114/64  03/22/19 126/76  08/21/16 120/70    Wt Readings from Last 3 Encounters:  12/29/20 143 lb (64.9 kg)  08/21/16 134 lb 8 oz (61 kg)  02/24/16 141 lb (64 kg)    Physical Exam Vitals reviewed.  Constitutional:      General: She is not in acute distress.    Appearance: Normal appearance. She is not ill-appearing, toxic-appearing or diaphoretic.  HENT:     Nose: Nose normal.     Mouth/Throat:     Mouth: Mucous membranes are moist.  Eyes:     General: No scleral icterus.    Conjunctiva/sclera: Conjunctivae normal.  Cardiovascular:     Rate and Rhythm: Normal rate and regular rhythm.     Heart sounds: No murmur heard. Pulmonary:     Effort: Pulmonary effort is normal.  Breath sounds: No stridor. No wheezing, rhonchi or rales.  Abdominal:     General: Abdomen is flat. Bowel sounds are normal. There is no distension.     Palpations: Abdomen is soft. There is no hepatomegaly, splenomegaly or mass.     Tenderness: There is generalized abdominal tenderness.     Hernia: No hernia is present.  Musculoskeletal:        General: Normal range of motion.     Cervical back: Neck supple.     Right lower leg: No edema.     Left lower leg: No edema.  Lymphadenopathy:      Cervical: No cervical adenopathy.  Skin:    General: Skin is warm and dry.     Findings: Rash present.     Comments: She tells me that she breaks out in a red itchy rash when she gets exposed to the sun.  This has not happened in the last few weeks and she has no rash today.  Neurological:     General: No focal deficit present.     Mental Status: She is alert.  Psychiatric:        Mood and Affect: Mood normal.        Behavior: Behavior normal.    Lab Results  Component Value Date   WBC 5.2 12/29/2020   HGB 13.4 12/29/2020   HCT 39.8 12/29/2020   PLT 197.0 12/29/2020   GLUCOSE 83 12/29/2020   CHOL 181 12/29/2020   TRIG 86.0 12/29/2020   HDL 69.50 12/29/2020   LDLCALC 94 12/29/2020   ALT 20 12/29/2020   AST 17 12/29/2020   NA 137 12/29/2020   K 3.9 12/29/2020   CL 104 12/29/2020   CREATININE 0.67 12/29/2020   BUN 9 12/29/2020   CO2 27 12/29/2020   TSH 1.00 12/29/2020    MM DIAG BREAST TOMO BILATERAL  Result Date: 07/16/2020 CLINICAL DATA:  45 year old female for 1 year follow-up of LEFT breast calcifications and for annual bilateral mammogram. EXAM: DIGITAL DIAGNOSTIC BILATERAL MAMMOGRAM WITH CAD AND TOMO COMPARISON:  Previous exam(s). ACR Breast Density Category c: The breast tissue is heterogeneously dense, which may obscure small masses. FINDINGS: 2D/3D full field views of both breasts and magnification views of the LEFT breast demonstrate a stable 0.2 cm group of punctate calcifications within the UPPER-OUTER LEFT breast. No new or suspicious findings within either breast noted. Mammographic images were processed with CAD. IMPRESSION: 1. Stable likely benign 0.2 cm group of UPPER-OUTER LEFT breast calcifications. One year follow-up recommended to ensure 2 year stability. 2. No new or suspicious mammographic findings within either breast. RECOMMENDATION: Bilateral diagnostic mammogram in 1 year with magnification views of the LEFT breast. I have discussed the findings and  recommendations with the patient. If applicable, a reminder letter will be sent to the patient regarding the next appointment. BI-RADS CATEGORY  3: Probably benign. Electronically Signed   By: Harmon Pier M.D.   On: 07/16/2020 12:02    Assessment & Plan:   Melissa Crawford was seen today for abdominal pain and annual exam.  Diagnoses and all orders for this visit:  Generalized abdominal pain- Her review of systems, exam, and labs are reassuring.  Will treat her for IBS with an antispasmodic. -     CBC with Differential/Platelet; Future -     Basic metabolic panel; Future -     Urinalysis, Routine w reflex microscopic; Future -     Urinalysis, Routine w reflex microscopic -  Basic metabolic panel -     CBC with Differential/Platelet -     hyoscyamine (LEVSIN SL) 0.125 MG SL tablet; Place 1 tablet (0.125 mg total) under the tongue every 6 (six) hours as needed.  Raynaud's disease without gangrene- Her ANA and sed rate are negative.  If this continues then will refer her to rheumatology. -     ANA; Future -     C-reactive protein; Future -     C-reactive protein -     ANA  Irritable bowel syndrome without diarrhea -     Discontinue: hyoscyamine (LEVSIN) 0.125 MG/5ML ELIX; Take 5 mLs (0.125 mg total) by mouth every 6 (six) hours as needed. -     hyoscyamine (LEVSIN SL) 0.125 MG SL tablet; Place 1 tablet (0.125 mg total) under the tongue every 6 (six) hours as needed.  Gastroesophageal reflux disease without esophagitis- I recommended that she take a PPI. -     CBC with Differential/Platelet; Future -     Lipase; Future -     esomeprazole (NEXIUM) 40 MG capsule; Take 1 capsule (40 mg total) by mouth daily. -     Discontinue: hyoscyamine (LEVSIN) 0.125 MG/5ML ELIX; Take 5 mLs (0.125 mg total) by mouth every 6 (six) hours as needed. -     Lipase -     CBC with Differential/Platelet -     hyoscyamine (LEVSIN SL) 0.125 MG SL tablet; Place 1 tablet (0.125 mg total) under the tongue every 6 (six)  hours as needed.  Encounter for general adult medical examination with abnormal findings- Exam completed, labs reviewed, vaccines reviewed and updated, cancer screenings are up-to-date, patient education was given. -     Lipid panel; Future -     HIV Antibody (routine testing w rflx); Future -     Hepatitis C antibody; Future -     Hepatitis C antibody -     HIV Antibody (routine testing w rflx) -     Lipid panel  Abdominal bloating- See above. -     Hepatic function panel; Future -     TSH; Future -     Gliadin antibodies, serum; Future -     Tissue transglutaminase, IgA; Future -     Reticulin Antibody, IgA w reflex titer; Future -     Discontinue: hyoscyamine (LEVSIN) 0.125 MG/5ML ELIX; Take 5 mLs (0.125 mg total) by mouth every 6 (six) hours as needed. -     Reticulin Antibody, IgA w reflex titer -     Tissue transglutaminase, IgA -     Gliadin antibodies, serum -     TSH -     Hepatic function panel -     hyoscyamine (LEVSIN SL) 0.125 MG SL tablet; Place 1 tablet (0.125 mg total) under the tongue every 6 (six) hours as needed.  Need for hepatitis C screening test -     Hepatitis C antibody; Future -     Hepatitis C antibody  Other orders -     TIQ-MISC  I have discontinued Melissa Crawford's hyoscyamine. I am also having her start on esomeprazole and hyoscyamine.  Meds ordered this encounter  Medications   esomeprazole (NEXIUM) 40 MG capsule    Sig: Take 1 capsule (40 mg total) by mouth daily.    Dispense:  90 capsule    Refill:  0   DISCONTD: hyoscyamine (LEVSIN) 0.125 MG/5ML ELIX    Sig: Take 5 mLs (0.125 mg total) by mouth every 6 (six) hours  as needed.    Dispense:  473 mL    Refill:  0   hyoscyamine (LEVSIN SL) 0.125 MG SL tablet    Sig: Place 1 tablet (0.125 mg total) under the tongue every 6 (six) hours as needed.    Dispense:  60 tablet    Refill:  3      Follow-up: Return in about 3 months (around 03/31/2021).  Sanda Lingerhomas Danicka Hourihan, MD

## 2020-12-29 NOTE — Patient Instructions (Signed)

## 2020-12-31 ENCOUNTER — Encounter: Payer: Self-pay | Admitting: Internal Medicine

## 2020-12-31 LAB — TIQ-MISC: QUESTION:: INVALID

## 2020-12-31 LAB — HEPATITIS C ANTIBODY
Hepatitis C Ab: NONREACTIVE
SIGNAL TO CUT-OFF: 0.01 (ref <1.00)

## 2020-12-31 LAB — GLIADIN ANTIBODIES, SERUM
Gliadin IgA: 1.1 U/mL
Gliadin IgG: <1 U/mL

## 2020-12-31 LAB — TISSUE TRANSGLUTAMINASE, IGA: (tTG) Ab, IgA: <1 U/mL

## 2020-12-31 LAB — HIV ANTIBODY (ROUTINE TESTING W REFLEX): HIV 1&2 Ab, 4th Generation: NONREACTIVE

## 2020-12-31 LAB — ANA: Anti Nuclear Antibody (ANA): NEGATIVE

## 2020-12-31 MED ORDER — HYOSCYAMINE SULFATE 0.125 MG SL SUBL: 0.1250 mg | SUBLINGUAL_TABLET | Freq: Four times a day (QID) | SUBLINGUAL | 3 refills | Status: DC | PRN

## 2021-01-07 ENCOUNTER — Other Ambulatory Visit: Payer: Self-pay | Admitting: Internal Medicine

## 2021-01-07 DIAGNOSIS — R140 Abdominal distension (gaseous): Secondary | ICD-10-CM

## 2021-01-07 DIAGNOSIS — K589 Irritable bowel syndrome without diarrhea: Secondary | ICD-10-CM

## 2021-01-07 DIAGNOSIS — R1084 Generalized abdominal pain: Secondary | ICD-10-CM

## 2021-01-07 DIAGNOSIS — K219 Gastro-esophageal reflux disease without esophagitis: Secondary | ICD-10-CM

## 2021-01-31 ENCOUNTER — Other Ambulatory Visit: Payer: Self-pay | Admitting: Internal Medicine

## 2021-01-31 DIAGNOSIS — K219 Gastro-esophageal reflux disease without esophagitis: Secondary | ICD-10-CM

## 2021-01-31 DIAGNOSIS — R140 Abdominal distension (gaseous): Secondary | ICD-10-CM

## 2021-01-31 DIAGNOSIS — R1084 Generalized abdominal pain: Secondary | ICD-10-CM

## 2021-01-31 DIAGNOSIS — K589 Irritable bowel syndrome without diarrhea: Secondary | ICD-10-CM

## 2021-02-18 ENCOUNTER — Other Ambulatory Visit: Payer: Self-pay | Admitting: Internal Medicine

## 2021-02-18 DIAGNOSIS — R1084 Generalized abdominal pain: Secondary | ICD-10-CM

## 2021-02-18 DIAGNOSIS — R140 Abdominal distension (gaseous): Secondary | ICD-10-CM

## 2021-02-18 DIAGNOSIS — K589 Irritable bowel syndrome without diarrhea: Secondary | ICD-10-CM

## 2021-02-18 DIAGNOSIS — K219 Gastro-esophageal reflux disease without esophagitis: Secondary | ICD-10-CM

## 2021-07-30 ENCOUNTER — Other Ambulatory Visit: Payer: Self-pay | Admitting: Obstetrics and Gynecology

## 2021-07-30 DIAGNOSIS — R921 Mammographic calcification found on diagnostic imaging of breast: Secondary | ICD-10-CM

## 2021-08-17 ENCOUNTER — Other Ambulatory Visit: Payer: Self-pay | Admitting: Obstetrics and Gynecology

## 2021-08-17 ENCOUNTER — Ambulatory Visit
Admission: RE | Admit: 2021-08-17 | Discharge: 2021-08-17 | Disposition: A | Discharge status: Home / Self Care | Payer: Commercial Managed Care - PPO | Source: Ambulatory Visit | Attending: Obstetrics and Gynecology | Admitting: Obstetrics and Gynecology

## 2021-08-17 DIAGNOSIS — R921 Mammographic calcification found on diagnostic imaging of breast: Secondary | ICD-10-CM

## 2021-08-31 ENCOUNTER — Ambulatory Visit
Admission: RE | Admit: 2021-08-31 | Discharge: 2021-08-31 | Disposition: A | Discharge status: Home / Self Care | Payer: Commercial Managed Care - PPO | Source: Ambulatory Visit | Attending: Obstetrics and Gynecology | Admitting: Obstetrics and Gynecology

## 2021-08-31 DIAGNOSIS — R921 Mammographic calcification found on diagnostic imaging of breast: Secondary | ICD-10-CM

## 2021-09-30 ENCOUNTER — Ambulatory Visit: Payer: Self-pay | Admitting: General Surgery

## 2021-09-30 DIAGNOSIS — N6092 Unspecified benign mammary dysplasia of left breast: Secondary | ICD-10-CM

## 2021-10-03 ENCOUNTER — Other Ambulatory Visit: Payer: Self-pay | Admitting: General Surgery

## 2021-10-03 DIAGNOSIS — N6092 Unspecified benign mammary dysplasia of left breast: Secondary | ICD-10-CM

## 2021-10-06 ENCOUNTER — Telehealth: Payer: Self-pay | Admitting: Hematology and Oncology

## 2021-10-06 NOTE — Telephone Encounter (Signed)
Scheduled appt per 5/4 referral. Pt is aware of appt date and time. Pt is aware to arrive 15 mins prior to appt time and to bring and updated insurance card. Pt is aware of appt location.   ?

## 2021-10-14 NOTE — Progress Notes (Signed)
[facility] CONSULT NOTE  Patient Care Team: Etta Grandchild, MD as PCP - General  CHIEF COMPLAINTS/PURPOSE OF CONSULTATION:  Left breast atypical lobular hyperplasia  HISTORY OF PRESENTING ILLNESS:  Melissa Crawford 46 y.o. female is here because of recent diagnosis of left breast atypical lobular hyperplasia. She presents to the clinic for a consult.  She is being scheduled to undergo lumpectomy procedure.  She was sent to [place] for discussion regarding her high risk for breast cancer screening and to discuss risk reduction strategies and surveillance options.  She is here today accompanied by her husband.  She had a routine screening mammogram which detected a slight disturbance in the breast which led to biopsies that came back as atypical lobular hyperplasia.  I reviewed her records extensively and collaborated the history with the patient.    MEDICAL HISTORY:  Past Medical History:  Diagnosis Date   IBS (irritable bowel syndrome)-Constipation Predominant 12/31/2012   Rosacea     SURGICAL HISTORY: Past Surgical History:  Procedure Laterality Date   CESAREAN SECTION  09/05/2009   ENDOMETRIAL ABLATION      SOCIAL HISTORY: Social History   Socioeconomic History   Marital status: Married    Spouse name: Not on file   Number of children: Not on file   Years of education: Not on file   Highest education level: Not on file  Occupational History   Occupation: Art gallery manager  Tobacco Use   Smoking status: Never   Smokeless tobacco: Never  Vaping Use   Vaping Use: Never used  Substance and Sexual Activity   Alcohol use: Not Currently    Alcohol/week: 7.0 standard drinks    Types: 7 Glasses of wine per week   Drug use: No   Sexual activity: Not on file  Other Topics Concern   Not on file  Social History Narrative   Married, Art gallery manager at [employer]   1 son   No caffeine   Regular Exercise -  YES   Social Determinants of Health   Financial Resource Strain:  Not on file  Food Insecurity: Not on file  Transportation Needs: Not on file  Physical Activity: Not on file  Stress: Not on file  Social Connections: Not on file  Intimate Partner Violence: Not on file    FAMILY HISTORY: Family History  Problem Relation Age of Onset   Breast cancer Mother        in 70's   Arthritis Other    Diabetes Other    Breast cancer Maternal Grandmother     ALLERGIES:  is allergic to augmentin [amoxicillin-pot clavulanate].  MEDICATIONS:  Current Outpatient Medications  Medication Sig Dispense Refill   cetirizine (ZYRTEC) 10 MG tablet Take 10 mg by mouth daily.     cholecalciferol (VITAMIN D3) 25 MCG (1000 UNIT) tablet Take 1,000 Units by mouth daily.     esomeprazole (NEXIUM) 40 MG capsule Take 1 capsule (40 mg total) by mouth daily. 90 capsule 0   hyoscyamine (LEVSIN SL) 0.125 MG SL tablet PLACE 1 TABLET (0.125 MG TOTAL) UNDER THE TONGUE EVERY 6 (SIX) HOURS AS NEEDED. 360 tablet 0   tamoxifen (NOLVADEX) 10 MG tablet Take 1 tablet (10 mg total) by mouth daily. 90 tablet 3   No current facility-administered medications for this visit.    REVIEW OF SYSTEMS:   Constitutional: Denies fevers, chills or abnormal night sweats All other systems were reviewed with the patient and are negative.  PHYSICAL EXAMINATION: ECOG PERFORMANCE STATUS:  1 - Symptomatic but completely ambulatory  Vitals:   10/24/21 1548  BP: 118/81  Pulse: 71  Resp: 16  Temp: 97.9 F (36.6 C)  SpO2: 100%   Filed Weights   10/24/21 1548  Weight: 144 lb 6.4 oz (65.5 kg)    LABORATORY DATA:  I have reviewed the data as listed Lab Results  Component Value Date   WBC 5.2 12/29/2020   HGB 13.4 12/29/2020   HCT 39.8 12/29/2020   MCV 90.9 12/29/2020   PLT 197.0 12/29/2020   Lab Results  Component Value Date   NA 137 12/29/2020   K 3.9 12/29/2020   CL 104 12/29/2020   CO2 27 12/29/2020    RADIOGRAPHIC STUDIES: I have personally reviewed the radiological reports and  agreed with the findings in the report.  ASSESSMENT AND PLAN:  Atypical lobular hyperplasia [facility]) of left breast 08/31/2021: Left breast biopsy: Atypical lobular hyperplasia: This is characterized by abnormal cells that are filling part of the lobule.  It appears to increase the risk of breast cancer by 3.75.3 fold.  There is risk of both ipsilateral and contralateral breast cancers.  The cumulative incidence of breast cancer is approximately 1 %/year.  Risk assessment: Based upon NCI breast cancer risk assessment tool: Risk of breast cancer is 4.4% (Ave risk 0.4%) Tyrer-Cuzick:  10-year risk: 8% (average risk 2%) Lifetime risk: 32.8% (average risk 10.5%)  Risk reduction strategies:  recommended appropriate lifestyle and dietary changes that include exercise, eating less red meat and increasing fruits and vegetables.  (She is already doing all of the above) Risk reduction with tamoxifen or raloxifene would reduce the risk by half.  Tamoxifen counseling: We discussed the risks and benefits of tamoxifen. These include but not limited to insomnia, hot flashes, mood changes, vaginal dryness, and weight gain. Although rare, serious side effects including endometrial cancer, risk of blood clots were also discussed. We strongly believe that the benefits far outweigh the risks. Patient understands these risks and consented to starting treatment. Planned treatment duration is 5 years.  We discussed the dosage of 10 mg to be adequate based upon the TAM-01 clinical trial  Breast cancer surveillance:  Annual mammograms,: Annually in March breast MRI annually in September  Patient has a lumpectomy coming up.  She will start tamoxifen 1 week after surgery is done. Follow-up in 3 months with a telephone visit Otherwise we will see her once a year for follow-up if she is tolerating tamoxifen extremely well.   All questions were answered. The patient knows to call the clinic with any problems, questions or  concerns.    Tamsen Meek, MD 10/24/21  I Janan Ridge am scribing for Dr. Pamelia Hoit  I have reviewed the above documentation for accuracy and completeness, and I agree with the above.

## 2021-10-21 ENCOUNTER — Other Ambulatory Visit: Payer: Self-pay

## 2021-10-21 ENCOUNTER — Encounter (HOSPITAL_BASED_OUTPATIENT_CLINIC_OR_DEPARTMENT_OTHER): Payer: Self-pay | Admitting: General Surgery

## 2021-10-24 ENCOUNTER — Other Ambulatory Visit: Payer: Self-pay

## 2021-10-24 ENCOUNTER — Telehealth: Payer: Self-pay | Admitting: Hematology and Oncology

## 2021-10-24 ENCOUNTER — Inpatient Hospital Stay: Payer: Commercial Managed Care - PPO | Attending: Hematology and Oncology | Admitting: Hematology and Oncology

## 2021-10-24 VITALS — BP 118/81 | HR 71 | Temp 97.9°F | Resp 16 | Ht 66.0 in | Wt 144.4 lb

## 2021-10-24 DIAGNOSIS — Z9189 Other specified personal risk factors, not elsewhere classified: Secondary | ICD-10-CM

## 2021-10-24 DIAGNOSIS — N6092 Unspecified benign mammary dysplasia of left breast: Secondary | ICD-10-CM | POA: Diagnosis present

## 2021-10-24 DIAGNOSIS — Z803 Family history of malignant neoplasm of breast: Secondary | ICD-10-CM | POA: Diagnosis not present

## 2021-10-24 MED ORDER — TAMOXIFEN CITRATE 10 MG PO TABS: 10.0000 mg | ORAL_TABLET | Freq: Every day | ORAL | 3 refills | Status: AC

## 2021-10-24 MED ORDER — TAMOXIFEN CITRATE 10 MG PO TABS: 10.0000 mg | ORAL_TABLET | Freq: Two times a day (BID) | ORAL | 3 refills | Status: DC

## 2021-10-24 NOTE — Telephone Encounter (Signed)
Scheduled appointment per 5/22 los. Patient is aware. 

## 2021-10-24 NOTE — Assessment & Plan Note (Addendum)
08/31/2021: Left breast biopsy: Atypical lobular hyperplasia: This is characterized by abnormal cells that are filling part of the lobule.  It appears to increase the risk of breast cancer by 3.75.3 fold.  There is risk of both ipsilateral and contralateral breast cancers.  The cumulative incidence of breast cancer is approximately 1 %/year.  Risk assessment: Based upon NCI breast cancer risk assessment tool: Risk of breast cancer is 4.4% (Ave risk 0.4%) Tyrer-Cuzick:  10-year risk: 8% (average risk 2%) Lifetime risk: 32.8% (average risk 10.5%)  Risk reduction strategies:  recommended appropriate lifestyle and dietary changes that include exercise, eating less red meat and increasing fruits and vegetables.  (She is already doing all of the above) Risk reduction with tamoxifen or raloxifene would reduce the risk by half.  Tamoxifen counseling: We discussed the risks and benefits of tamoxifen. These include but not limited to insomnia, hot flashes, mood changes, vaginal dryness, and weight gain. Although rare, serious side effects including endometrial cancer, risk of blood clots were also discussed. We strongly believe that the benefits far outweigh the risks. Patient understands these risks and consented to starting treatment. Planned treatment duration is 5 years.  We discussed the dosage of 10 mg to be adequate based upon the TAM-01 clinical trial  Breast cancer surveillance:  Annual mammograms,: Annually in March breast MRI annually in September  Patient has a lumpectomy coming up.  She will start tamoxifen 1 week after surgery is done. Follow-up in 3 months with a telephone visit Otherwise we will see her once a year for follow-up if she is tolerating tamoxifen extremely well.

## 2021-11-01 ENCOUNTER — Ambulatory Visit
Admission: RE | Admit: 2021-11-01 | Discharge: 2021-11-01 | Disposition: A | Discharge status: Home / Self Care | Payer: Commercial Managed Care - PPO | Source: Ambulatory Visit | Attending: General Surgery | Admitting: General Surgery

## 2021-11-01 DIAGNOSIS — N6092 Unspecified benign mammary dysplasia of left breast: Secondary | ICD-10-CM

## 2021-11-01 NOTE — Progress Notes (Signed)

## 2021-11-02 ENCOUNTER — Ambulatory Visit (HOSPITAL_BASED_OUTPATIENT_CLINIC_OR_DEPARTMENT_OTHER)
Admission: RE | Admit: 2021-11-02 | Discharge: 2021-11-02 | Disposition: A | Discharge status: Home / Self Care | Payer: Commercial Managed Care - PPO | Attending: General Surgery | Admitting: General Surgery

## 2021-11-02 ENCOUNTER — Other Ambulatory Visit: Payer: Self-pay

## 2021-11-02 ENCOUNTER — Ambulatory Visit
Admission: RE | Admit: 2021-11-02 | Discharge: 2021-11-02 | Disposition: A | Discharge status: Home / Self Care | Payer: Commercial Managed Care - PPO | Source: Ambulatory Visit | Attending: General Surgery | Admitting: General Surgery

## 2021-11-02 ENCOUNTER — Encounter (HOSPITAL_BASED_OUTPATIENT_CLINIC_OR_DEPARTMENT_OTHER)
Admission: RE | Disposition: A | Discharge status: Home / Self Care | Payer: Self-pay | Source: Home / Self Care | Attending: General Surgery

## 2021-11-02 ENCOUNTER — Encounter (HOSPITAL_BASED_OUTPATIENT_CLINIC_OR_DEPARTMENT_OTHER): Payer: Self-pay | Admitting: General Surgery

## 2021-11-02 ENCOUNTER — Ambulatory Visit (HOSPITAL_BASED_OUTPATIENT_CLINIC_OR_DEPARTMENT_OTHER): Payer: Commercial Managed Care - PPO | Admitting: Anesthesiology

## 2021-11-02 DIAGNOSIS — N6092 Unspecified benign mammary dysplasia of left breast: Secondary | ICD-10-CM

## 2021-11-02 DIAGNOSIS — Z803 Family history of malignant neoplasm of breast: Secondary | ICD-10-CM | POA: Diagnosis not present

## 2021-11-02 DIAGNOSIS — Z01818 Encounter for other preprocedural examination: Secondary | ICD-10-CM

## 2021-11-02 DIAGNOSIS — K219 Gastro-esophageal reflux disease without esophagitis: Secondary | ICD-10-CM | POA: Insufficient documentation

## 2021-11-02 DIAGNOSIS — F32A Depression, unspecified: Secondary | ICD-10-CM | POA: Diagnosis not present

## 2021-11-02 DIAGNOSIS — Z853 Personal history of malignant neoplasm of breast: Secondary | ICD-10-CM | POA: Diagnosis not present

## 2021-11-02 HISTORY — PX: BREAST LUMPECTOMY WITH RADIOACTIVE SEED LOCALIZATION: SHX6424

## 2021-11-02 LAB — POCT PREGNANCY, URINE: Preg Test, Ur: NEGATIVE

## 2021-11-02 SURGERY — BREAST LUMPECTOMY WITH RADIOACTIVE SEED LOCALIZATION
Anesthesia: General | Site: Breast | Laterality: Left

## 2021-11-02 MED ORDER — LIDOCAINE 2% (20 MG/ML) 5 ML SYRINGE
INTRAMUSCULAR | Status: DC | PRN
  Administered 2021-11-02: 60 mg via INTRAVENOUS

## 2021-11-02 MED ORDER — FENTANYL CITRATE (PF) 100 MCG/2ML IJ SOLN: 25.0000 ug | INTRAMUSCULAR | Status: DC | PRN

## 2021-11-02 MED ORDER — CELECOXIB 200 MG PO CAPS
200.0000 mg | ORAL_CAPSULE | ORAL | Status: AC
  Administered 2021-11-02: 200 mg via ORAL

## 2021-11-02 MED ORDER — OXYCODONE HCL 5 MG PO TABS: 5.0000 mg | ORAL_TABLET | Freq: Four times a day (QID) | ORAL | 0 refills | Status: DC | PRN

## 2021-11-02 MED ORDER — VANCOMYCIN HCL IN DEXTROSE 1-5 GM/200ML-% IV SOLN
1000.0000 mg | INTRAVENOUS | Status: AC
  Administered 2021-11-02: 1000 mg via INTRAVENOUS

## 2021-11-02 MED ORDER — GABAPENTIN 300 MG PO CAPS
300.0000 mg | ORAL_CAPSULE | ORAL | Status: AC
  Administered 2021-11-02: 300 mg via ORAL

## 2021-11-02 MED ORDER — CHLORHEXIDINE GLUCONATE CLOTH 2 % EX PADS: 6.0000 | MEDICATED_PAD | Freq: Once | CUTANEOUS | Status: DC

## 2021-11-02 MED ORDER — OXYCODONE HCL 5 MG PO TABS
ORAL_TABLET | ORAL | Status: AC
  Filled 2021-11-02: qty 1

## 2021-11-02 MED ORDER — ONDANSETRON HCL 4 MG/2ML IJ SOLN
INTRAMUSCULAR | Status: AC
  Filled 2021-11-02: qty 2

## 2021-11-02 MED ORDER — PROPOFOL 10 MG/ML IV BOLUS
INTRAVENOUS | Status: DC | PRN
  Administered 2021-11-02: 150 mg via INTRAVENOUS

## 2021-11-02 MED ORDER — ACETAMINOPHEN 500 MG PO TABS
ORAL_TABLET | ORAL | Status: AC
  Filled 2021-11-02: qty 2

## 2021-11-02 MED ORDER — ACETAMINOPHEN 500 MG PO TABS
1000.0000 mg | ORAL_TABLET | ORAL | Status: AC
  Administered 2021-11-02: 1000 mg via ORAL

## 2021-11-02 MED ORDER — VANCOMYCIN HCL IN DEXTROSE 1-5 GM/200ML-% IV SOLN
INTRAVENOUS | Status: AC
  Filled 2021-11-02: qty 200

## 2021-11-02 MED ORDER — LIDOCAINE 2% (20 MG/ML) 5 ML SYRINGE
INTRAMUSCULAR | Status: AC
  Filled 2021-11-02: qty 5

## 2021-11-02 MED ORDER — MIDAZOLAM HCL 5 MG/5ML IJ SOLN
INTRAMUSCULAR | Status: DC | PRN
  Administered 2021-11-02: 2 mg via INTRAVENOUS

## 2021-11-02 MED ORDER — OXYCODONE HCL 5 MG PO TABS
5.0000 mg | ORAL_TABLET | Freq: Once | ORAL | Status: AC | PRN
  Administered 2021-11-02: 5 mg via ORAL

## 2021-11-02 MED ORDER — ONDANSETRON HCL 4 MG/2ML IJ SOLN: 4.0000 mg | Freq: Once | INTRAMUSCULAR | Status: DC | PRN

## 2021-11-02 MED ORDER — MIDAZOLAM HCL 2 MG/2ML IJ SOLN
INTRAMUSCULAR | Status: AC
  Filled 2021-11-02: qty 2

## 2021-11-02 MED ORDER — SCOPOLAMINE 1 MG/3DAYS TD PT72
1.0000 | MEDICATED_PATCH | TRANSDERMAL | Status: DC
  Administered 2021-11-02: 1.5 mg via TRANSDERMAL

## 2021-11-02 MED ORDER — LACTATED RINGERS IV SOLN: INTRAVENOUS | Status: DC

## 2021-11-02 MED ORDER — BUPIVACAINE-EPINEPHRINE (PF) 0.25% -1:200000 IJ SOLN
INTRAMUSCULAR | Status: DC | PRN
  Administered 2021-11-02: 8 mL via PERINEURAL

## 2021-11-02 MED ORDER — ONDANSETRON HCL 4 MG/2ML IJ SOLN
INTRAMUSCULAR | Status: DC | PRN
  Administered 2021-11-02: 4 mg via INTRAVENOUS

## 2021-11-02 MED ORDER — FENTANYL CITRATE (PF) 100 MCG/2ML IJ SOLN
INTRAMUSCULAR | Status: AC
  Filled 2021-11-02: qty 2

## 2021-11-02 MED ORDER — BUPIVACAINE-EPINEPHRINE (PF) 0.5% -1:200000 IJ SOLN
INTRAMUSCULAR | Status: AC
  Filled 2021-11-02: qty 30

## 2021-11-02 MED ORDER — SCOPOLAMINE 1 MG/3DAYS TD PT72
MEDICATED_PATCH | TRANSDERMAL | Status: AC
  Filled 2021-11-02: qty 1

## 2021-11-02 MED ORDER — CHLORHEXIDINE GLUCONATE CLOTH 2 % EX PADS
6.0000 | MEDICATED_PAD | Freq: Once | CUTANEOUS | Status: AC
  Administered 2021-11-02: 6 via TOPICAL

## 2021-11-02 MED ORDER — AMISULPRIDE (ANTIEMETIC) 5 MG/2ML IV SOLN: 10.0000 mg | Freq: Once | INTRAVENOUS | Status: DC | PRN

## 2021-11-02 MED ORDER — PROPOFOL 10 MG/ML IV BOLUS
INTRAVENOUS | Status: AC
  Filled 2021-11-02: qty 20

## 2021-11-02 MED ORDER — CELECOXIB 200 MG PO CAPS
ORAL_CAPSULE | ORAL | Status: AC
  Filled 2021-11-02: qty 1

## 2021-11-02 MED ORDER — GABAPENTIN 300 MG PO CAPS
ORAL_CAPSULE | ORAL | Status: AC
  Filled 2021-11-02: qty 1

## 2021-11-02 MED ORDER — DEXAMETHASONE SODIUM PHOSPHATE 4 MG/ML IJ SOLN
INTRAMUSCULAR | Status: DC | PRN
  Administered 2021-11-02: 8 mg via INTRAVENOUS

## 2021-11-02 MED ORDER — DEXAMETHASONE SODIUM PHOSPHATE 10 MG/ML IJ SOLN
INTRAMUSCULAR | Status: AC
  Filled 2021-11-02: qty 1

## 2021-11-02 MED ORDER — FENTANYL CITRATE (PF) 100 MCG/2ML IJ SOLN
INTRAMUSCULAR | Status: DC | PRN
  Administered 2021-11-02: 50 ug via INTRAVENOUS

## 2021-11-02 MED ORDER — OXYCODONE HCL 5 MG/5ML PO SOLN: 5.0000 mg | Freq: Once | ORAL | Status: AC | PRN

## 2021-11-02 SURGICAL SUPPLY — 44 items
ADH SKN CLS APL DERMABOND .7 (GAUZE/BANDAGES/DRESSINGS) ×1
APL PRP STRL LF DISP 70% ISPRP (MISCELLANEOUS) ×1
APPLIER CLIP 9.375 MED OPEN (MISCELLANEOUS)
APR CLP MED 9.3 20 MLT OPN (MISCELLANEOUS)
BLADE SURG 15 STRL LF DISP TIS (BLADE) ×1 IMPLANT
BLADE SURG 15 STRL SS (BLADE) ×2
CANISTER SUC SOCK COL 7IN (MISCELLANEOUS) ×2 IMPLANT
CANISTER SUCT 1200ML W/VALVE (MISCELLANEOUS) ×2 IMPLANT
CHLORAPREP W/TINT 26 (MISCELLANEOUS) ×2 IMPLANT
CLIP APPLIE 9.375 MED OPEN (MISCELLANEOUS) IMPLANT
COVER BACK TABLE 60X90IN (DRAPES) ×2 IMPLANT
COVER MAYO STAND STRL (DRAPES) ×2 IMPLANT
COVER PROBE W GEL 5X96 (DRAPES) ×2 IMPLANT
DERMABOND ADVANCED (GAUZE/BANDAGES/DRESSINGS) ×1
DERMABOND ADVANCED .7 DNX12 (GAUZE/BANDAGES/DRESSINGS) ×1 IMPLANT
DRAPE LAPAROSCOPIC ABDOMINAL (DRAPES) ×2 IMPLANT
DRAPE UTILITY XL STRL (DRAPES) ×2 IMPLANT
ELECT COATED BLADE 2.86 ST (ELECTRODE) ×2 IMPLANT
ELECT REM PT RETURN 9FT ADLT (ELECTROSURGICAL) ×2
ELECTRODE REM PT RTRN 9FT ADLT (ELECTROSURGICAL) ×1 IMPLANT
GLOVE BIO SURGEON STRL SZ 6.5 (GLOVE) ×1 IMPLANT
GLOVE BIO SURGEON STRL SZ7.5 (GLOVE) ×4 IMPLANT
GLOVE SURG SS PI 7.5 STRL IVOR (GLOVE) ×1 IMPLANT
GOWN STRL REUS W/ TWL LRG LVL3 (GOWN DISPOSABLE) ×2 IMPLANT
GOWN STRL REUS W/TWL LRG LVL3 (GOWN DISPOSABLE) ×4
ILLUMINATOR WAVEGUIDE N/F (MISCELLANEOUS) IMPLANT
KIT MARKER MARGIN INK (KITS) ×2 IMPLANT
LIGHT WAVEGUIDE WIDE FLAT (MISCELLANEOUS) IMPLANT
NDL HYPO 25X1 1.5 SAFETY (NEEDLE) IMPLANT
NEEDLE HYPO 25X1 1.5 SAFETY (NEEDLE) IMPLANT
NS IRRIG 1000ML POUR BTL (IV SOLUTION) IMPLANT
PACK BASIN DAY SURGERY FS (CUSTOM PROCEDURE TRAY) ×2 IMPLANT
PENCIL SMOKE EVACUATOR (MISCELLANEOUS) ×2 IMPLANT
SLEEVE SCD COMPRESS KNEE MED (STOCKING) ×2 IMPLANT
SPIKE FLUID TRANSFER (MISCELLANEOUS) IMPLANT
SPONGE T-LAP 18X18 ~~LOC~~+RFID (SPONGE) ×2 IMPLANT
SUT MON AB 4-0 PC3 18 (SUTURE) ×2 IMPLANT
SUT SILK 2 0 SH (SUTURE) IMPLANT
SUT VICRYL 3-0 CR8 SH (SUTURE) ×2 IMPLANT
SYR CONTROL 10ML LL (SYRINGE) IMPLANT
TOWEL GREEN STERILE FF (TOWEL DISPOSABLE) ×2 IMPLANT
TRAY FAXITRON CT DISP (TRAY / TRAY PROCEDURE) ×2 IMPLANT
TUBE CONNECTING 20X1/4 (TUBING) ×2 IMPLANT
YANKAUER SUCT BULB TIP NO VENT (SUCTIONS) IMPLANT

## 2021-11-02 NOTE — Anesthesia Postprocedure Evaluation (Signed)
Anesthesia Post Note  Patient: Melissa Crawford  Procedure(s) Performed: LEFT BREAST LUMPECTOMY WITH RADIOACTIVE SEED LOCALIZATION (Left: Breast)     Patient location during evaluation: PACU Anesthesia Type: General Level of consciousness: awake Pain management: pain level controlled Vital Signs Assessment: post-procedure vital signs reviewed and stable Respiratory status: spontaneous breathing and respiratory function stable Cardiovascular status: stable Postop Assessment: no apparent nausea or vomiting Anesthetic complications: no   No notable events documented.  Last Vitals:  Vitals:   11/02/21 1200 11/02/21 1210  BP: 120/85 (!) 123/95  Pulse: 70 66  Resp: 17 18  Temp:  36.7 C  SpO2: 99% 100%    Last Pain:  Vitals:   11/02/21 1214  TempSrc:   PainSc: 3                  Candra R Curren Mohrmann

## 2021-11-02 NOTE — Discharge Instructions (Addendum)
  Post Anesthesia Home Care Instructions  Activity: Get plenty of rest for the remainder of the day. A responsible individual must stay with you for 24 hours following the procedure.  For the next 24 hours, DO NOT: -Drive a car -Advertising copywriter -Drink alcoholic beverages -Take any medication unless instructed by your physician -Make any legal decisions or sign important papers.  Meals: Start with liquid foods such as gelatin or soup. Progress to regular foods as tolerated. Avoid greasy, spicy, heavy foods. If nausea and/or vomiting occur, drink only clear liquids until the nausea and/or vomiting subsides. Call your physician if vomiting continues.  Special Instructions/Symptoms: Your throat may feel dry or sore from the anesthesia or the breathing tube placed in your throat during surgery. If this causes discomfort, gargle with warm salt water. The discomfort should disappear within 24 hours.  If you had a scopolamine patch placed behind your ear for the management of post- operative nausea and/or vomiting:  1. The medication in the patch is effective for 72 hours, after which it should be removed.  Wrap patch in a tissue and discard in the trash. Wash hands thoroughly with soap and water. 2. You may remove the patch earlier than 72 hours if you experience unpleasant side effects which may include dry mouth, dizziness or visual disturbances. 3. Avoid touching the patch. Wash your hands with soap and water after contact with the patch.  Next dose of Tylenol can be given at 4:50pm if needed. Next dose of NSAID (Ibuprofen/Motrin/Aleve) can be given at 6:50pm if needed.

## 2021-11-02 NOTE — Transfer of Care (Signed)
Immediate Anesthesia Transfer of Care Note  Patient: Melissa Crawford  Procedure(s) Performed: LEFT BREAST LUMPECTOMY WITH RADIOACTIVE SEED LOCALIZATION (Left: Breast)  Patient Location: PACU  Anesthesia Type:General  Level of Consciousness: sedated  Airway & Oxygen Therapy: Patient Spontanous Breathing and Patient connected to face mask oxygen  Post-op Assessment: Report given to RN and Post -op Vital signs reviewed and stable  Post vital signs: Reviewed and stable  Last Vitals:  Vitals Value Taken Time  BP 120/83 11/02/21 1140  Temp    Pulse 69 11/02/21 1141  Resp 10 11/02/21 1141  SpO2 100 % 11/02/21 1141  Vitals shown include unvalidated device data.  Last Pain:  Vitals:   11/02/21 1040  TempSrc: Oral  PainSc: 0-No pain      Patients Stated Pain Goal: 3 (11/02/21 1040)  Complications: No notable events documented.

## 2021-11-02 NOTE — H&P (Signed)
REFERRING PHYSICIAN: Lajuana Matte, MD  PROVIDER: Lindell Noe, MD  MRN: [id number] DOB: Apr 27, 1976 Subjective   Chief Complaint: Breast Problem   History of Present Illness: Melissa Crawford is a 46 y.o. female who is seen today as an office consultation for evaluation of Breast Problem .   We are asked to see the patient in consultation by Dr. 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 The patient is a 46 year old white female who recently went for a routine screening mammogram. She was actually getting 49-month follow-ups for some calcifications in the breast. She had a new 5 mm area of calcification in the upper outer quadrant of the left breast. This was biopsied and came back as atypical lobular hyperplasia. She is otherwise in good health and does not smoke. She does have a history of breast cancer in her mother and grandmother.  Review of Systems: A complete review of systems was obtained from the patient. I have reviewed this information and discussed as appropriate with the patient. See HPI as well for other ROS.  ROS   Medical History: History reviewed. No pertinent past medical history.  Patient Active Problem List  Diagnosis   Atypical lobular hyperplasia of left breast   Past Surgical History:  Procedure Laterality Date   CESAREAN SECTION N/A    Allergies  Allergen Reactions   Amoxicillin-Pot Clavulanate Rash  Pt developed rash on day 7 of therapy   Penicillamine Rash   No current outpatient medications on file prior to visit.   No current facility-administered medications on file prior to visit.   Family History  Problem Relation Age of Onset   Breast cancer Mother    Social History   Tobacco Use  Smoking Status Never  Smokeless Tobacco Never    Social History   Socioeconomic History   Marital status: Married  Tobacco Use   Smoking status: Never   Smokeless tobacco: Never  Vaping Use    Vaping Use: Never used  Substance and Sexual Activity   Alcohol use: Never   Drug use: Never   Objective:   Vitals:  BP: 122/74  Pulse: 82  Temp: 36.2 C (97.2 F)  SpO2: 98%  Weight: 65.3 kg (144 lb)  Height: 167.6 cm (5\' 6" )   Body mass index is 23.24 kg/m.  Physical Exam Vitals reviewed.  Constitutional:  General: She is not in acute distress. Appearance: Normal appearance.  HENT:  Head: Normocephalic and atraumatic.  Right Ear: External ear normal.  Left Ear: External ear normal.  Nose: Nose normal.  Mouth/Throat:  Mouth: Mucous membranes are moist.  Pharynx: Oropharynx is clear.  Eyes:  General: No scleral icterus. Extraocular Movements: Extraocular movements intact.  Conjunctiva/sclera: Conjunctivae normal.  Pupils: Pupils are equal, round, and reactive to light.  Cardiovascular:  Rate and Rhythm: Normal rate and regular rhythm.  Pulses: Normal pulses.  Heart sounds: Normal heart sounds.  Pulmonary:  Effort: Pulmonary effort is normal. No respiratory distress.  Breath sounds: Normal breath sounds.  Abdominal:  General: Bowel sounds are normal.  Palpations: Abdomen is soft.  Tenderness: There is no abdominal tenderness.  Musculoskeletal:  General: No swelling, tenderness or deformity. Normal range of motion.  Cervical back: Normal range of motion and neck supple.  Skin: General: Skin is warm and dry.  Coloration: Skin is not jaundiced.  Neurological:  General: No focal deficit present.  Mental Status: She is alert and oriented to person, place, and time.  Psychiatric:  Mood and Affect: Mood normal.  Behavior: Behavior normal.     Breast: The patient has normal dense symmetric breast tissue bilaterally. There is no palpable mass in either breast. There is no palpable axillary, supraclavicular, or cervical lymphadenopathy.  Labs, Imaging and Diagnostic Testing:  Assessment and Plan:   Diagnoses and all orders for this visit:  Atypical lobular  hyperplasia of left breast - Ambulatory Referral to Oncology-Medical - [facility] Case Posting Request; Future    The patient appears to have a 5 mm area of atypical lobular hyperplasia in the upper outer quadrant of the left breast. Because this can have an appearance very similar to preinvasive cancer and because it is considered a high risk lesion and the recommendation is to have this area removed. I have discussed with her in detail the risks and benefits of the operation as well as some of the technical aspects including the use of a radioactive seed for localization and she understands and wishes to proceed. I will also refer her to the high risk clinic at the cancer center to talk about risk reduction. She is interested in bilateral mastectomies but is not quite ready to schedule that just yet. I will go ahead and refer her also to plastic surgery to talk about reconstructive options. She will let us know when she is ready for bilateral mastectomy surgery.

## 2021-11-02 NOTE — Op Note (Signed)
11/02/2021  11:35 AM  PATIENT:  Melissa Crawford  46 y.o. female  PRE-OPERATIVE DIAGNOSIS:  LEFT BREAST ALH  POST-OPERATIVE DIAGNOSIS:  LEFT BREAST ALH  PROCEDURE:  Procedure(s): LEFT BREAST LUMPECTOMY WITH RADIOACTIVE SEED LOCALIZATION (Left)  SURGEON:  Surgeon(s) and Role:    * Jovita Kussmaul, MD - Primary  PHYSICIAN ASSISTANT:   ASSISTANTS: none   ANESTHESIA:   local and general  EBL:  10 mL   BLOOD ADMINISTERED:none  DRAINS: none   LOCAL MEDICATIONS USED:  MARCAINE     SPECIMEN:  Source of Specimen:  left breast tissue  DISPOSITION OF SPECIMEN:  PATHOLOGY  COUNTS:  YES  TOURNIQUET:  * No tourniquets in log *  DICTATION: .Dragon Dictation  After informed consent was obtained the patient was brought to the operating room and placed in the supine position on the operating table.  After adequate induction of general anesthesia the patient's left breast was prepped with ChloraPrep, allowed to dry, and draped in usual sterile manner.  An appropriate timeout was performed.  Previously an I-125 seed was placed in the lower outer left breast to mark an area of atypical lobular hyperplasia.  The neoprobe was set to I-125 in the area of radioactivity was readily identified.  The area around this was infiltrated with quarter percent Marcaine.  A curvilinear incision was made along the inframammary fold nearest to the radioactive seed in the lower left breast.  The incision was then carried through the skin and subcutaneous tissue sharply with the electrocautery until the dissection reached the chest wall.  The dissection was then carried superiorly between the chest wall and the breast tissue.  Once the dissection was beyond the area of the radioactive seed I then turned my attention to the breast tissue and dissected anteriorly between the breast tissue and the subcutaneous fat and skin.  This dissection was carried superiorly until we were beyond the area of the radioactive seed.  I  then removed a circular portion of breast tissue sharply with the electrocautery around the radioactive seed while checking the area of radioactivity frequently.  Once the specimen was removed it was oriented with the appropriate paint colors.  A specimen radiograph was obtained that showed the clip and seed to be within the specimen.  The specimen was then sent to pathology for further evaluation.  Hemostasis was achieved using the Bovie electrocautery.  The cavity was then closed with layers of interrupted 3-0 Vicryl stitches.  The skin was then closed with a running 4-0 Monocryl subcuticular stitch.  Dermabond dressings were applied.  The patient tolerated the procedure well.  At the end of the case all needle sponge and instrument counts were correct.  The patient was then awakened and taken to recovery in stable condition.  PLAN OF CARE: Discharge to home after PACU  PATIENT DISPOSITION:  PACU - hemodynamically stable.   Delay start of Pharmacological VTE agent (>24hrs) due to surgical blood loss or risk of bleeding: not applicable

## 2021-11-02 NOTE — Anesthesia Preprocedure Evaluation (Addendum)
Anesthesia Evaluation  Patient identified by MRN, date of birth, ID band Patient awake    Reviewed: Allergy & Precautions, NPO status , Patient's Chart, lab work & pertinent test results  Airway Mallampati: II  TM Distance: >3 FB Neck ROM: Full    Dental no notable dental hx.    Pulmonary neg pulmonary ROS,    Pulmonary exam normal breath sounds clear to auscultation       Cardiovascular negative cardio ROS Normal cardiovascular exam Rhythm:Regular Rate:Normal     Neuro/Psych PSYCHIATRIC DISORDERS Depression negative neurological ROS     GI/Hepatic Neg liver ROS, GERD  Medicated,  Endo/Other  negative endocrine ROS  Renal/GU negative Renal ROS  negative genitourinary   Musculoskeletal negative musculoskeletal ROS (+)   Abdominal   Peds negative pediatric ROS (+)  Hematology negative hematology ROS (+)   Anesthesia Other Findings Raynaud's Breast ca  Reproductive/Obstetrics negative OB ROS                             Anesthesia Physical Anesthesia Plan  ASA: 2  Anesthesia Plan: General   Post-op Pain Management: Tylenol PO (pre-op)*   Induction: Intravenous  PONV Risk Score and Plan: 3 and Treatment may vary due to age or medical condition, Scopolamine patch - Pre-op, Midazolam, Ondansetron and Dexamethasone  Airway Management Planned: LMA  Additional Equipment: None  Intra-op Plan:   Post-operative Plan: Extubation in OR  Informed Consent: I have reviewed the patients History and Physical, chart, labs and discussed the procedure including the risks, benefits and alternatives for the proposed anesthesia with the patient or authorized representative who has indicated his/her understanding and acceptance.     Dental advisory given  Plan Discussed with: CRNA, Surgeon and Anesthesiologist  Anesthesia Plan Comments:         Anesthesia Quick Evaluation

## 2021-11-02 NOTE — Interval H&P Note (Signed)
History and Physical Interval Note:  11/02/2021 10:48 AM  Melissa Crawford  has presented today for surgery, with the diagnosis of LEFT BREAST ALH.  The various methods of treatment have been discussed with the patient and family. After consideration of risks, benefits and other options for treatment, the patient has consented to  Procedure(s): LEFT BREAST LUMPECTOMY WITH RADIOACTIVE SEED LOCALIZATION (Left) as a surgical intervention.  The patient's history has been reviewed, patient examined, no change in status, stable for surgery.  I have reviewed the patient's chart and labs.  Questions were answered to the patient's satisfaction.     Autumn Messing III

## 2021-11-03 ENCOUNTER — Encounter (HOSPITAL_BASED_OUTPATIENT_CLINIC_OR_DEPARTMENT_OTHER): Payer: Self-pay | Admitting: General Surgery

## 2021-11-03 LAB — SURGICAL PATHOLOGY

## 2021-12-02 ENCOUNTER — Encounter (HOSPITAL_COMMUNITY): Payer: Self-pay

## 2022-01-18 ENCOUNTER — Encounter: Payer: Self-pay | Admitting: Internal Medicine

## 2022-01-18 ENCOUNTER — Other Ambulatory Visit: Payer: Self-pay | Admitting: Internal Medicine

## 2022-01-18 DIAGNOSIS — K219 Gastro-esophageal reflux disease without esophagitis: Secondary | ICD-10-CM

## 2022-01-18 DIAGNOSIS — K589 Irritable bowel syndrome without diarrhea: Secondary | ICD-10-CM

## 2022-01-18 DIAGNOSIS — R140 Abdominal distension (gaseous): Secondary | ICD-10-CM

## 2022-01-18 DIAGNOSIS — R1084 Generalized abdominal pain: Secondary | ICD-10-CM

## 2022-01-19 ENCOUNTER — Encounter: Payer: Self-pay | Admitting: Internal Medicine

## 2022-01-19 ENCOUNTER — Ambulatory Visit (INDEPENDENT_AMBULATORY_CARE_PROVIDER_SITE_OTHER): Payer: Commercial Managed Care - PPO | Admitting: Internal Medicine

## 2022-01-19 VITALS — BP 104/78 | HR 91 | Temp 98.0°F | Resp 16 | Ht 66.0 in | Wt 139.0 lb

## 2022-01-19 DIAGNOSIS — R10816 Epigastric abdominal tenderness: Secondary | ICD-10-CM | POA: Insufficient documentation

## 2022-01-19 DIAGNOSIS — Z0001 Encounter for general adult medical examination with abnormal findings: Secondary | ICD-10-CM

## 2022-01-19 DIAGNOSIS — K581 Irritable bowel syndrome with constipation: Secondary | ICD-10-CM | POA: Diagnosis not present

## 2022-01-19 DIAGNOSIS — R1084 Generalized abdominal pain: Secondary | ICD-10-CM

## 2022-01-19 DIAGNOSIS — R140 Abdominal distension (gaseous): Secondary | ICD-10-CM

## 2022-01-19 DIAGNOSIS — K219 Gastro-esophageal reflux disease without esophagitis: Secondary | ICD-10-CM | POA: Diagnosis not present

## 2022-01-19 DIAGNOSIS — K589 Irritable bowel syndrome without diarrhea: Secondary | ICD-10-CM

## 2022-01-19 LAB — HEPATIC FUNCTION PANEL
ALT: 12 U/L (ref 0–35)
AST: 17 U/L (ref 0–37)
Albumin: 4.3 g/dL (ref 3.5–5.2)
Alkaline Phosphatase: 28 U/L — ABNORMAL LOW (ref 39–117)
Bilirubin, Direct: 0.1 mg/dL (ref 0.0–0.3)
Total Bilirubin: 0.5 mg/dL (ref 0.2–1.2)
Total Protein: 7.3 g/dL (ref 6.0–8.3)

## 2022-01-19 LAB — CBC WITH DIFFERENTIAL/PLATELET
Basophils Absolute: 0.1 10*3/uL (ref 0.0–0.1)
Basophils Relative: 2.1 % (ref 0.0–3.0)
Eosinophils Absolute: 0 10*3/uL (ref 0.0–0.7)
Eosinophils Relative: 0.8 % (ref 0.0–5.0)
HCT: 44 % (ref 36.0–46.0)
Hemoglobin: 14.4 g/dL (ref 12.0–15.0)
Lymphocytes Relative: 23.2 % (ref 12.0–46.0)
Lymphs Abs: 1.3 10*3/uL (ref 0.7–4.0)
MCHC: 32.8 g/dL (ref 30.0–36.0)
MCV: 90 fl (ref 78.0–100.0)
Monocytes Absolute: 0.5 10*3/uL (ref 0.1–1.0)
Monocytes Relative: 8.2 % (ref 3.0–12.0)
Neutro Abs: 3.7 10*3/uL (ref 1.4–7.7)
Neutrophils Relative %: 65.7 % (ref 43.0–77.0)
Platelets: 222 10*3/uL (ref 150.0–400.0)
RBC: 4.88 Mil/uL (ref 3.87–5.11)
RDW: 12.4 % (ref 11.5–15.5)
WBC: 5.6 10*3/uL (ref 4.0–10.5)

## 2022-01-19 LAB — BASIC METABOLIC PANEL
BUN: 9 mg/dL (ref 6–23)
CO2: 27 mEq/L (ref 19–32)
Calcium: 9 mg/dL (ref 8.4–10.5)
Chloride: 105 mEq/L (ref 96–112)
Creatinine, Ser: 0.84 mg/dL (ref 0.40–1.20)
GFR: 84.77 mL/min (ref >60.00)
Glucose, Bld: 88 mg/dL (ref 70–99)
Potassium: 4.1 mEq/L (ref 3.5–5.1)
Sodium: 138 mEq/L (ref 135–145)

## 2022-01-19 LAB — AMYLASE: Amylase: 49 U/L (ref 27–131)

## 2022-01-19 LAB — TSH: TSH: 1.14 u[IU]/mL (ref 0.35–5.50)

## 2022-01-19 LAB — LIPASE: Lipase: 40 U/L (ref 11.0–59.0)

## 2022-01-19 MED ORDER — ESOMEPRAZOLE MAGNESIUM 40 MG PO CPDR: 40.0000 mg | DELAYED_RELEASE_CAPSULE | Freq: Every day | ORAL | 1 refills | Status: AC

## 2022-01-19 MED ORDER — HYOSCYAMINE SULFATE 0.125 MG SL SUBL: 0.1250 mg | SUBLINGUAL_TABLET | Freq: Four times a day (QID) | SUBLINGUAL | 0 refills | Status: DC | PRN

## 2022-01-19 NOTE — Progress Notes (Signed)
Subjective:  Patient ID: Melissa Crawford, female    DOB: 07-09-1975  Age: 46 y.o. MRN: [id number]  CC: Annual Exam, Abdominal Pain, and Gastroesophageal Reflux   HPI Melissa Crawford presents for a CPX and f/up -  She complains of a several month history of pressure sensation in her upper abdomen and under her lower sternum.  She also has intermittent bloating and constipation.  She has a history of GERD but is no longer taking the PPI.  She has had the symptoms before.  She denies odynophagia, dysphagia, cough, loss of appetite, or weight loss.  Outpatient Medications Prior to Visit  Medication Sig Dispense Refill   cetirizine (ZYRTEC) 10 MG tablet Take 10 mg by mouth daily.     cholecalciferol (VITAMIN D3) 25 MCG (1000 UNIT) tablet Take 1,000 Units by mouth daily.     tamoxifen (NOLVADEX) 10 MG tablet Take 1 tablet (10 mg total) by mouth daily. 90 tablet 3   esomeprazole (NEXIUM) 40 MG capsule Take 1 capsule (40 mg total) by mouth daily. 90 capsule 0   hyoscyamine (LEVSIN SL) 0.125 MG SL tablet PLACE 1 TABLET (0.125 MG TOTAL) UNDER THE TONGUE EVERY 6 (SIX) HOURS AS NEEDED. 360 tablet 0   oxyCODONE (ROXICODONE) 5 MG immediate release tablet Take 1 tablet (5 mg total) by mouth every 6 (six) hours as needed for severe pain. 10 tablet 0   No facility-administered medications prior to visit.    ROS Review of Systems  Constitutional: Negative.  Negative for diaphoresis, fatigue and unexpected weight change.  HENT: Negative.  Negative for trouble swallowing.   Respiratory:  Negative for cough, chest tightness, shortness of breath and wheezing.   Cardiovascular:  Negative for chest pain, palpitations and leg swelling.  Gastrointestinal:  Positive for abdominal pain and constipation. Negative for abdominal distention, diarrhea, nausea and vomiting.  Endocrine: Negative.   Genitourinary: Negative.  Negative for difficulty urinating, dysuria and hematuria.  Musculoskeletal: Negative.    Skin: Negative.   Neurological: Negative.  Negative for dizziness and weakness.  Hematological:  Negative for adenopathy. Does not bruise/bleed easily.  Psychiatric/Behavioral: Negative.      Objective:  BP 104/78 (BP Location: Left Arm, Patient Position: Sitting, Cuff Size: Large)   Pulse 91   Temp 98 F (36.7 C) (Oral)   Resp 16   Ht 5\' 6"  (1.676 m)   Wt 139 lb (63 kg)   SpO2 97%   BMI 22.44 kg/m   BP Readings from Last 3 Encounters:  01/19/22 104/78  11/02/21 (!) 123/95  10/24/21 118/81    Wt Readings from Last 3 Encounters:  01/19/22 139 lb (63 kg)  11/02/21 143 lb 8.3 oz (65.1 kg)  10/24/21 144 lb 6.4 oz (65.5 kg)    Physical Exam Vitals reviewed.  Constitutional:      Appearance: Normal appearance.  Eyes:     General: No scleral icterus.    Conjunctiva/sclera: Conjunctivae normal.  Cardiovascular:     Rate and Rhythm: Normal rate and regular rhythm.     Heart sounds: No murmur heard. Pulmonary:     Breath sounds: No stridor. No wheezing, rhonchi or rales.  Abdominal:     General: Bowel sounds are normal.     Palpations: There is no hepatomegaly, splenomegaly or mass.     Tenderness: There is abdominal tenderness in the epigastric area. There is no guarding or rebound. Negative signs include Murphy's sign.     Hernia: No hernia is present.  Musculoskeletal:  General: Normal range of motion.     Cervical back: Neck supple.     Right lower leg: No edema.     Left lower leg: No edema.  Lymphadenopathy:     Cervical: No cervical adenopathy.  Skin:    General: Skin is warm and dry.  Neurological:     General: No focal deficit present.     Mental Status: She is alert.  Psychiatric:        Mood and Affect: Mood normal.        Behavior: Behavior normal.     Lab Results  Component Value Date   WBC 5.6 01/19/2022   HGB 14.4 01/19/2022   HCT 44.0 01/19/2022   PLT 222.0 01/19/2022   GLUCOSE 88 01/19/2022   CHOL 181 12/29/2020   TRIG 86.0  12/29/2020   HDL 69.50 12/29/2020   LDLCALC 94 12/29/2020   ALT 12 01/19/2022   AST 17 01/19/2022   NA 138 01/19/2022   K 4.1 01/19/2022   CL 105 01/19/2022   CREATININE 0.84 01/19/2022   BUN 9 01/19/2022   CO2 27 01/19/2022   TSH 1.14 01/19/2022    MM Breast Surgical Specimen  Result Date: 11/02/2021 CLINICAL DATA:  Surgical specimen of the AA left breast lesion following radioactive seed localization. EXAM: SPECIMEN RADIOGRAPH OF THE LEFT BREAST COMPARISON:  None Available. FINDINGS: Status post excision of the left breast. The radioactive seed and biopsy marker clip are present, completely intact, and were marked for pathology. IMPRESSION: Specimen radiograph of the left breast. Electronically Signed   By: Amie Portland M.D.   On: 11/02/2021 11:36   Assessment & Plan:   Melissa Crawford was seen today for annual exam, abdominal pain and gastroesophageal reflux.  Diagnoses and all orders for this visit:  Epigastric abdominal tenderness without rebound tenderness- She has a paucity of other symptoms.  Her labs are normal.  Her examination is reassuring.  Will continue to treat IBS and GERD. -     Lipase; Future -     Basic metabolic panel; Future -     Amylase; Future -     TSH; Future -     Hepatic function panel; Future -     CBC with Differential/Platelet; Future -     CBC with Differential/Platelet -     Hepatic function panel -     TSH -     Amylase -     Basic metabolic panel -     Lipase  Gastroesophageal reflux disease without esophagitis -     CBC with Differential/Platelet; Future -     esomeprazole (NEXIUM) 40 MG capsule; Take 1 capsule (40 mg total) by mouth daily. -     hyoscyamine (LEVSIN SL) 0.125 MG SL tablet; Place 1 tablet (0.125 mg total) under the tongue every 6 (six) hours as needed. -     CBC with Differential/Platelet  Irritable bowel syndrome with constipation -     hyoscyamine (LEVSIN SL) 0.125 MG SL tablet; Place 1 tablet (0.125 mg total) under the  tongue every 6 (six) hours as needed.  Encounter for general adult medical examination with abnormal findings- Exam completed, labs reviewed, vaccines are up-to-date, cancer screenings are up-to-date, patient education was given.   I have discontinued Melissa Crawford's oxyCODONE. I am also having her maintain her cholecalciferol, cetirizine, tamoxifen, esomeprazole, and hyoscyamine.  Meds ordered this encounter  Medications   esomeprazole (NEXIUM) 40 MG capsule    Sig: Take 1 capsule (40  mg total) by mouth daily.    Dispense:  90 capsule    Refill:  1   hyoscyamine (LEVSIN SL) 0.125 MG SL tablet    Sig: Place 1 tablet (0.125 mg total) under the tongue every 6 (six) hours as needed.    Dispense:  360 tablet    Refill:  0     Follow-up: Return in about 6 months (around 07/22/2022), or if symptoms worsen or fail to improve.  Sanda Linger, MD

## 2022-01-19 NOTE — Patient Instructions (Signed)

## 2022-01-22 NOTE — Progress Notes (Signed)
HEMATOLOGY-ONCOLOGY TELEPHONE VISIT PROGRESS NOTE  I connected with our patient on 01/24/22 at  1:45 PM EDT by telephone and verified that I am speaking with the correct person using two identifiers.  I discussed the limitations, risks, security and privacy concerns of performing an evaluation and management service by telephone and the availability of in person appointments.  I also discussed with the patient that there may be a patient responsible charge related to this service. The patient expressed understanding and agreed to proceed.   History of Present Illness: Melissa Crawford  46 y.o. female is here because of recent diagnosis of left breast atypical lobular hyperplasia. She presents to the clinic for via telephone follow-up.  She denies any side effects to tamoxifen.  She is taking 10 mg daily without any problems or concerns.  REVIEW OF SYSTEMS:   Constitutional: Denies fevers, chills or abnormal weight loss All other systems were reviewed with the patient and are negative. Observations/Objective:     Assessment Plan:  Atypical lobular hyperplasia [facility]) of left breast 11/02/2021: Left lumpectomy: 3 mm focus of atypical lobular hyperplasia, margins negative  Risk assessment: Based upon NCI breast cancer risk assessment tool: Risk of breast cancer is 4.4% (Ave risk 0.4%) Tyrer-Cuzick:  10-year risk: 8% (average risk 2%) Lifetime risk: 32.8% (average risk 10.5%)  Current treatment treatment for prevention: Tamoxifen 10 mg daily started 10/24/2021 (based on TAM-01 clinical trial) Tamoxifen toxicities: No side effects.  She reports no major issues or concerns.  Breast cancer surveillance: Mammograms 08/17/2021 alternating with breast MRIs  Moved to [facility] She will find a local oncology group and will continue with her surveillance there.   I discussed the assessment and treatment plan with the patient. The patient was provided an opportunity to ask questions and all were  answered. The patient agreed with the plan and demonstrated an understanding of the instructions. The patient was advised to call back or seek an in-person evaluation if the symptoms worsen or if the condition fails to improve as anticipated.   I provided 12 minutes of non-face-to-face time during this encounter.  This includes time for charting and coordination of care   Tamsen Meek, MD   I Janan Ridge am scribing for Dr. Pamelia Hoit  I have reviewed the above documentation for accuracy and completeness, and I agree with the above.

## 2022-01-24 ENCOUNTER — Inpatient Hospital Stay: Payer: Commercial Managed Care - PPO | Attending: Hematology and Oncology | Admitting: Hematology and Oncology

## 2022-01-24 DIAGNOSIS — N6092 Unspecified benign mammary dysplasia of left breast: Secondary | ICD-10-CM

## 2022-01-24 NOTE — Assessment & Plan Note (Addendum)
11/02/2021: Left lumpectomy: 3 mm focus of atypical lobular hyperplasia, margins negative  Risk assessment: Based upon NCI breast cancer risk assessment tool: Risk of breast cancer is 4.4% (Ave risk 0.4%) Tyrer-Cuzick:  10-year risk: 8% (average risk 2%) Lifetime risk: 32.8% (average risk 10.5%)  Current treatment treatment for prevention: Tamoxifen 10 mg daily started 10/24/2021 Tamoxifen toxicities:  Breast cancer surveillance: Mammograms 08/17/2021 alternating with breast MRIs  Return to clinic in 1 year for follow-up

## 2022-04-15 ENCOUNTER — Other Ambulatory Visit: Payer: Self-pay | Admitting: Internal Medicine

## 2022-04-15 DIAGNOSIS — K581 Irritable bowel syndrome with constipation: Secondary | ICD-10-CM

## 2022-04-15 DIAGNOSIS — K219 Gastro-esophageal reflux disease without esophagitis: Secondary | ICD-10-CM

## 2022-09-10 IMAGING — MG MM PLC BREAST LOC DEV 1ST LESION INC MAMMO GUIDE*L*
8 of 13 series · 8 of 25 positions shown · non-contrast
Comparison: None Available.

CLINICAL DATA: Localization prior to surgery

EXAM:
MAMMOGRAPHIC GUIDED RADIOACTIVE SEED LOCALIZATION OF THE LEFT BREAST

[L CC (1 of 6)]
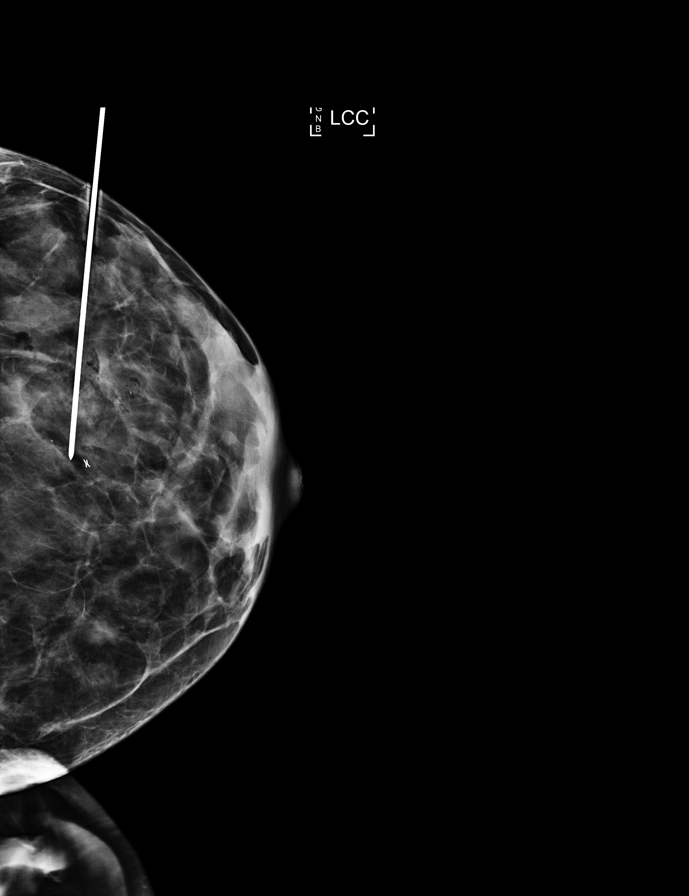

[L CC (2 of 6)]
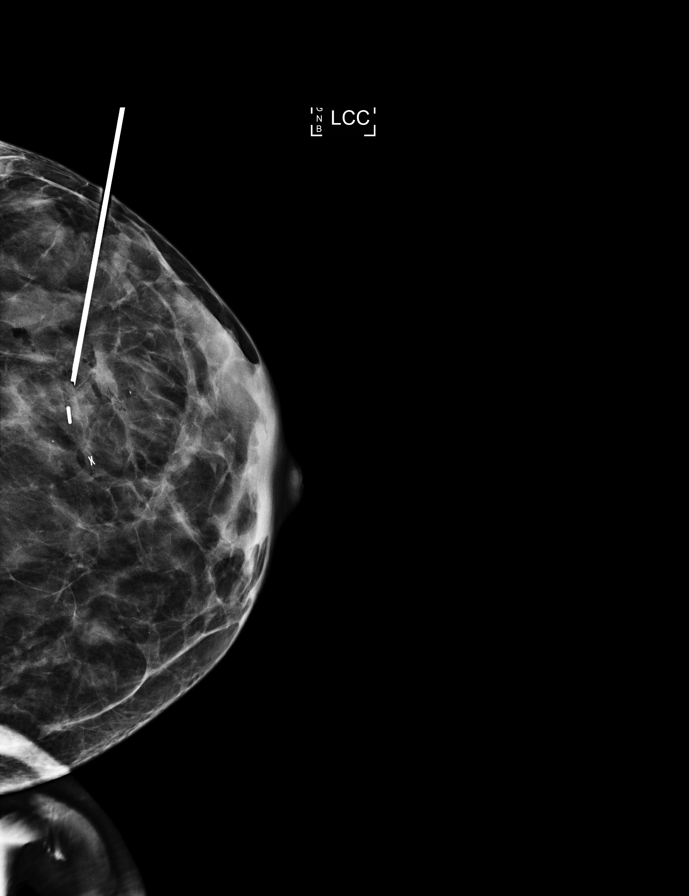

[L CC (3 of 6)]
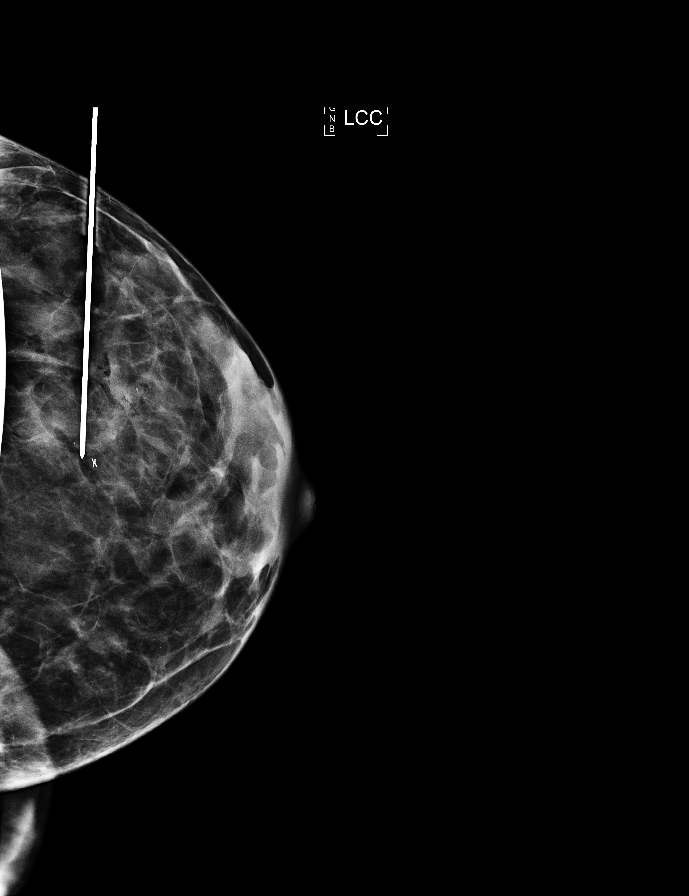

[L LM]
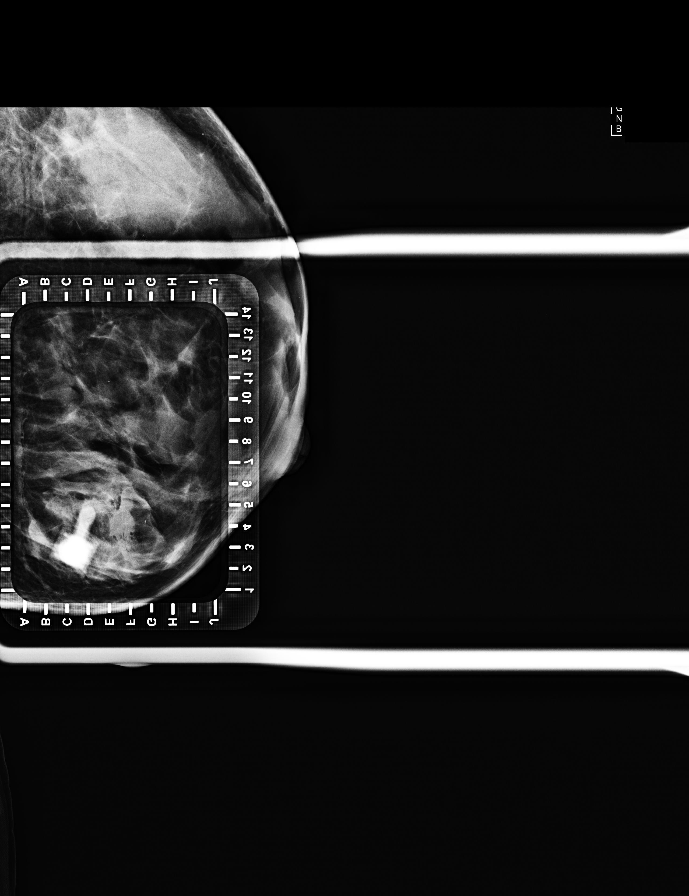

[L CC (4 of 6)]
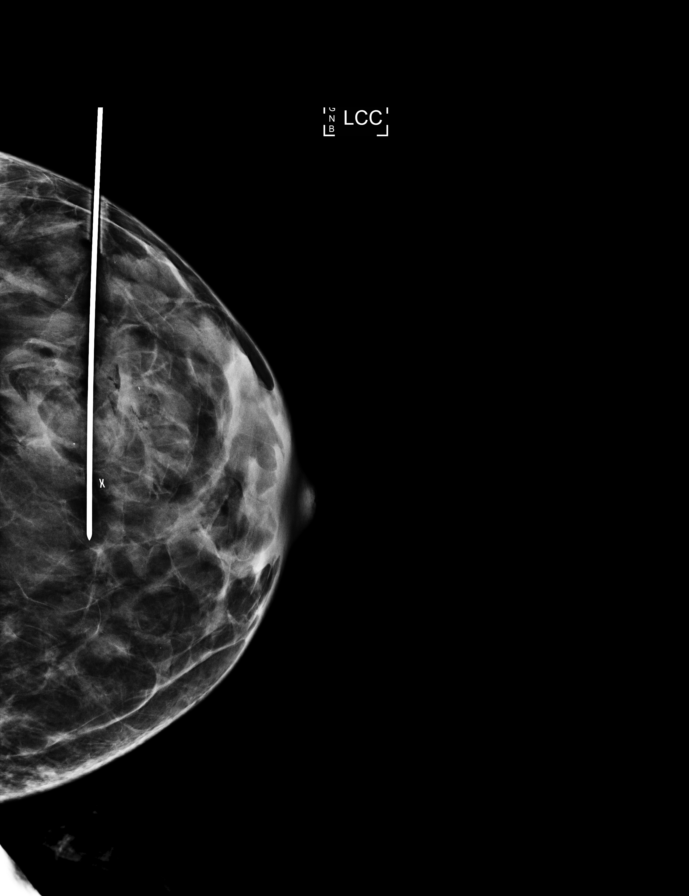

[L CC (5 of 6)]
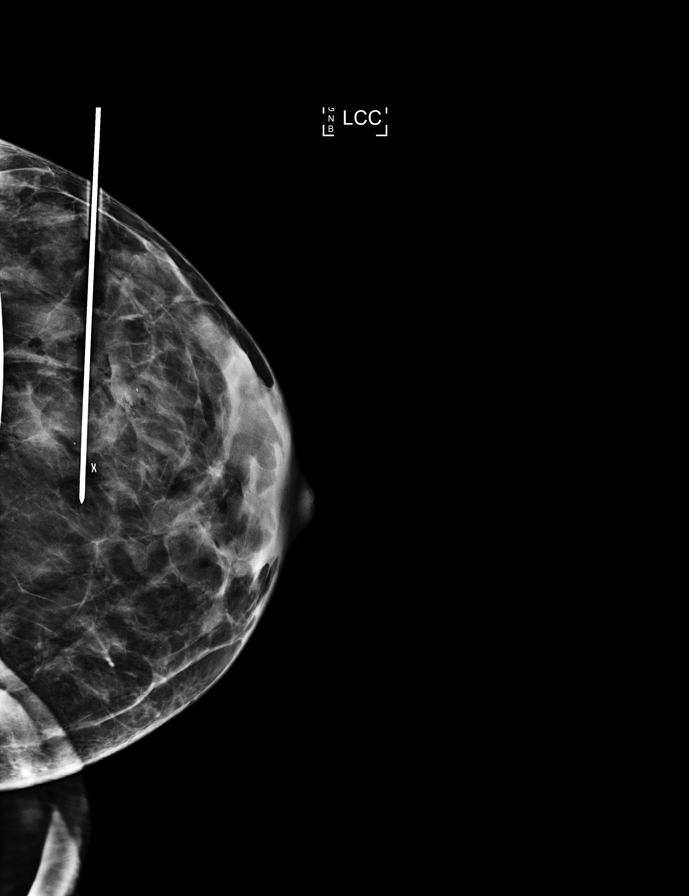

[L CC (6 of 6)]
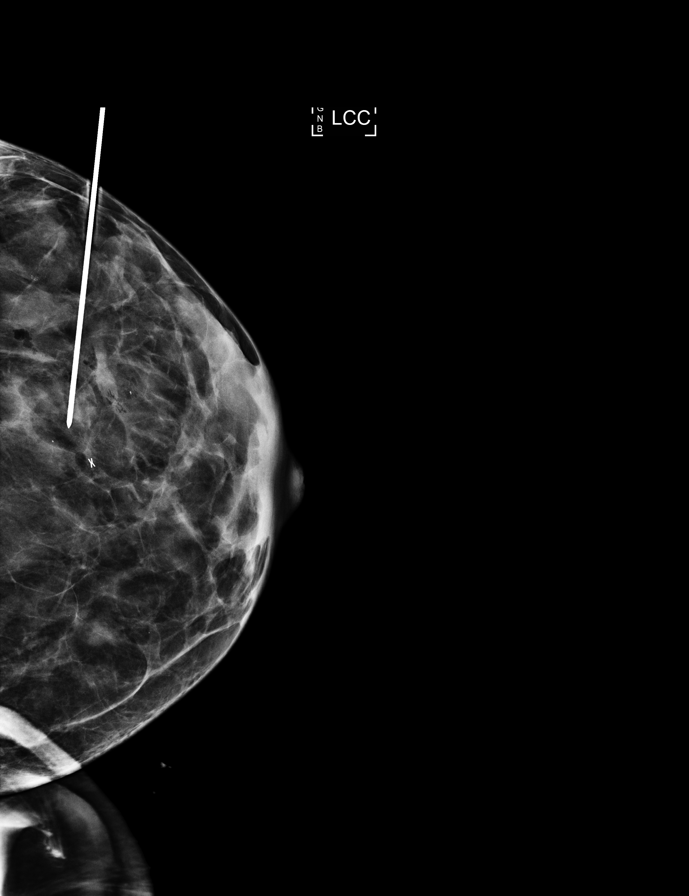

[L LM synth-2D]
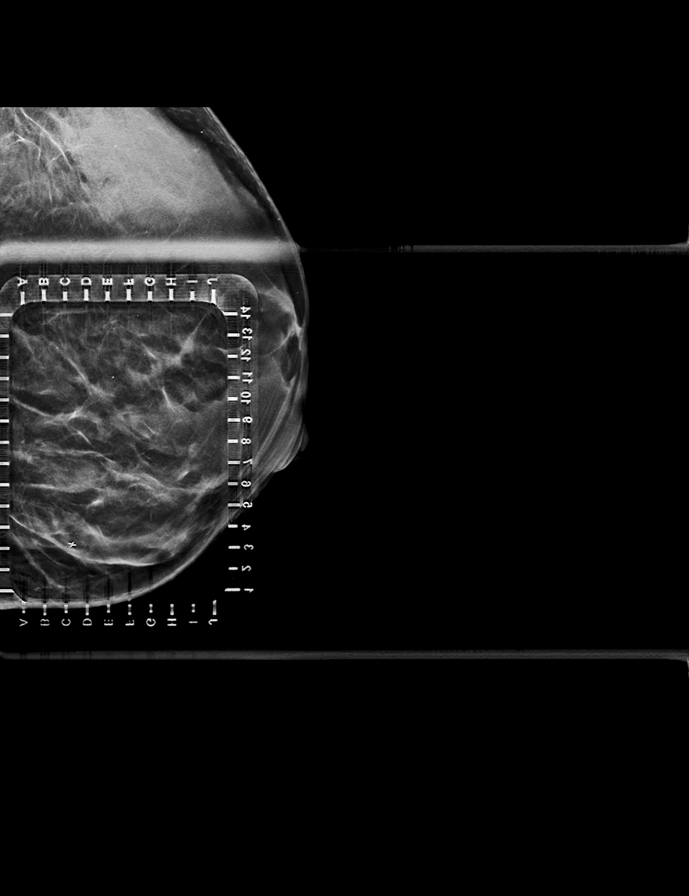

[8 of 25 positions shown; findings below may reference images not displayed]



The usual time-out protocol was performed immediately prior to the
procedure.

Using mammographic guidance, sterile technique, 1% lidocaine and an
X-3LI radioactive seed, the site of the patient's known DCIS was
localized using a lateral approach. The follow-up mammogram images
confirm the seed in the expected location and were marked for the
surgeon.

Follow-up survey of the patient confirms presence of the radioactive
seed.

Order number of X-3LI seed:  [id number].

Total activity:  0.250 millicuries reference Date: July 06, 2021

The patient tolerated the procedure well and was released from the
[REDACTED]. She was given instructions regarding seed removal.
IMPRESSION: Radioactive seed localization lefta breast. No apparent
complications.

## 2022-09-11 IMAGING — DX MM BREAST SURGICAL SPECIMEN
1 series · 2 of 2 positions shown · non-contrast
Comparison: None Available.

CLINICAL DATA: Surgical specimen of the AA left breast lesion
following radioactive seed localization.

EXAM:
SPECIMEN RADIOGRAPH OF THE LEFT BREAST

[Series 2: specimen digital x-ray, derived · left · 0.07mm/px · 2 of 2 slices shown]
[im 1/2]
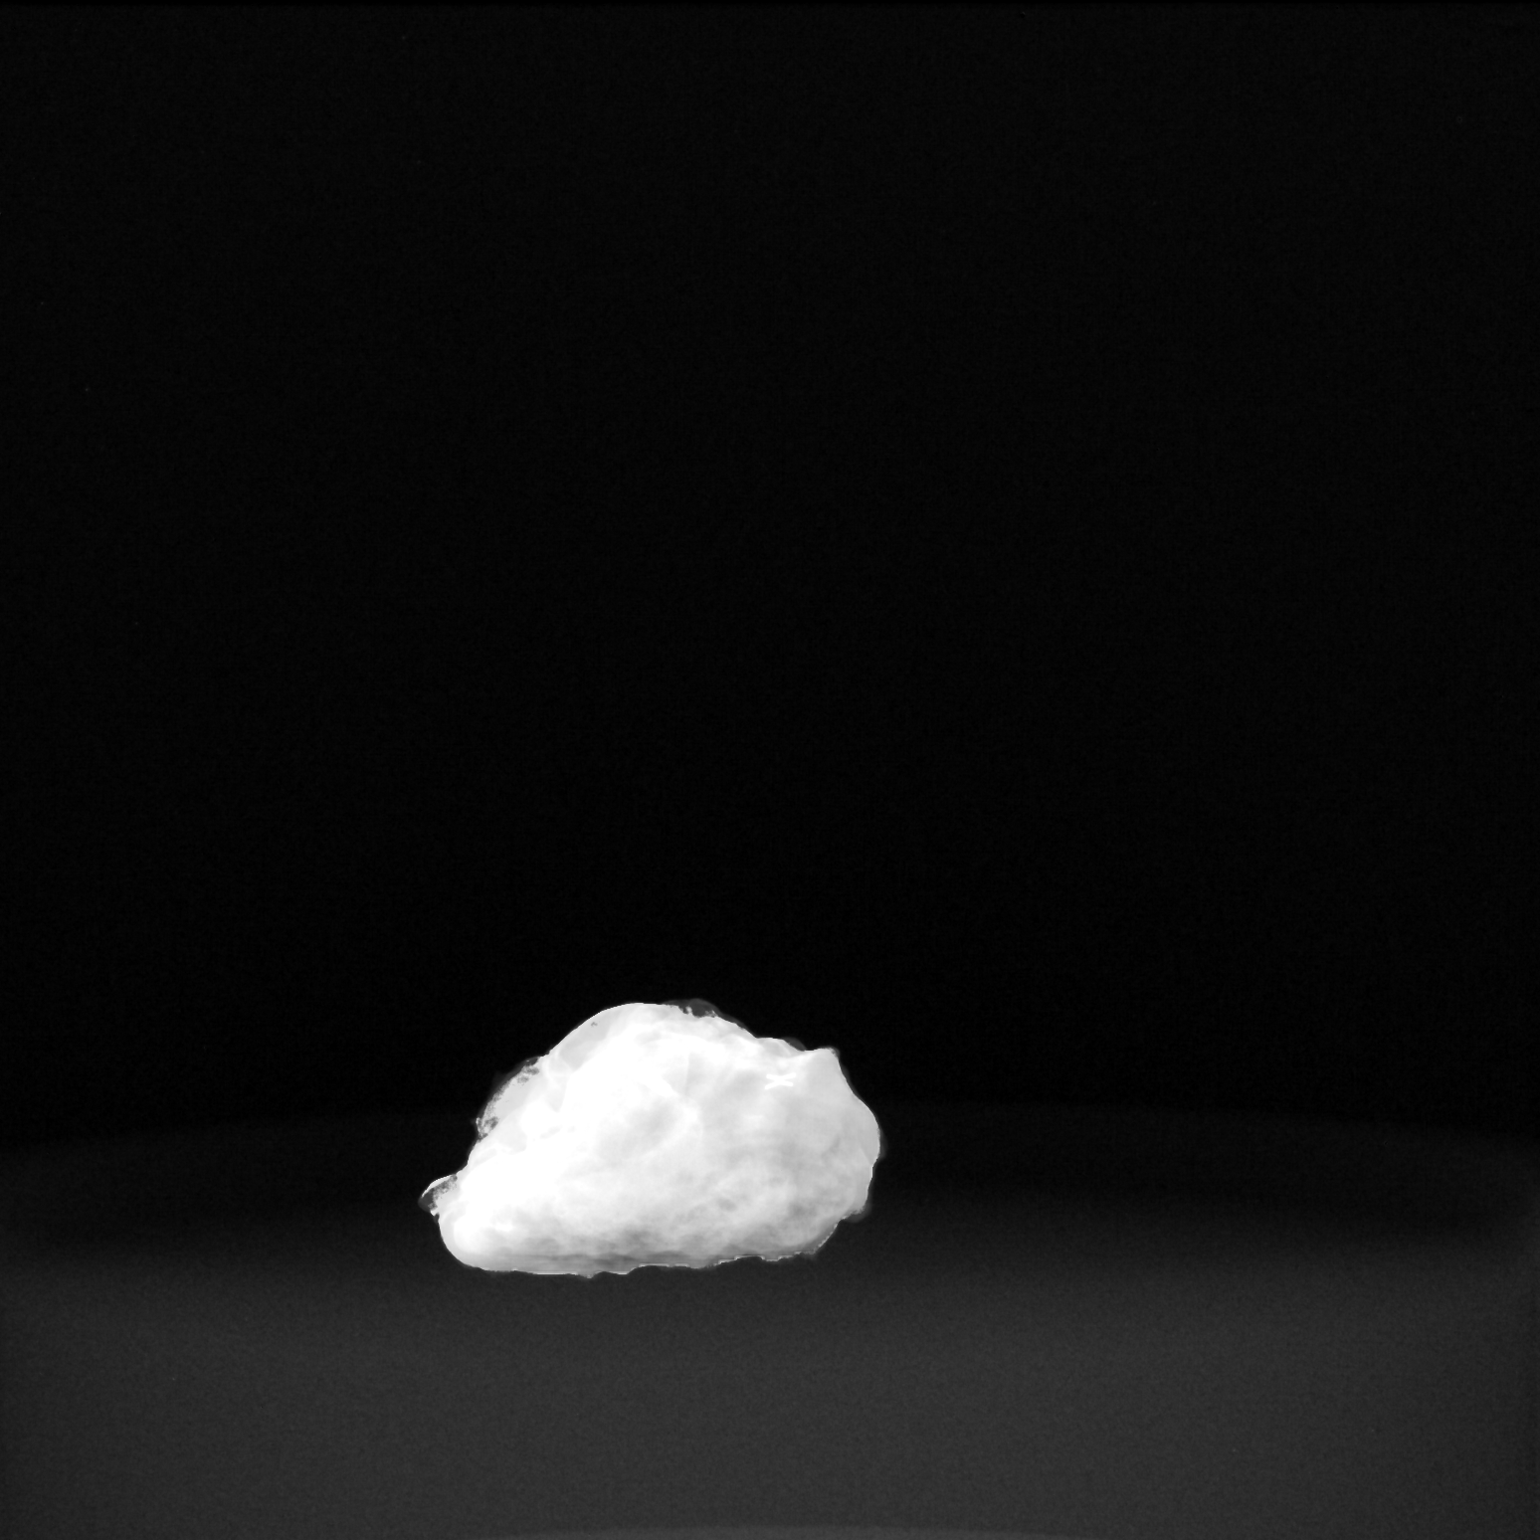
[im 2/2]
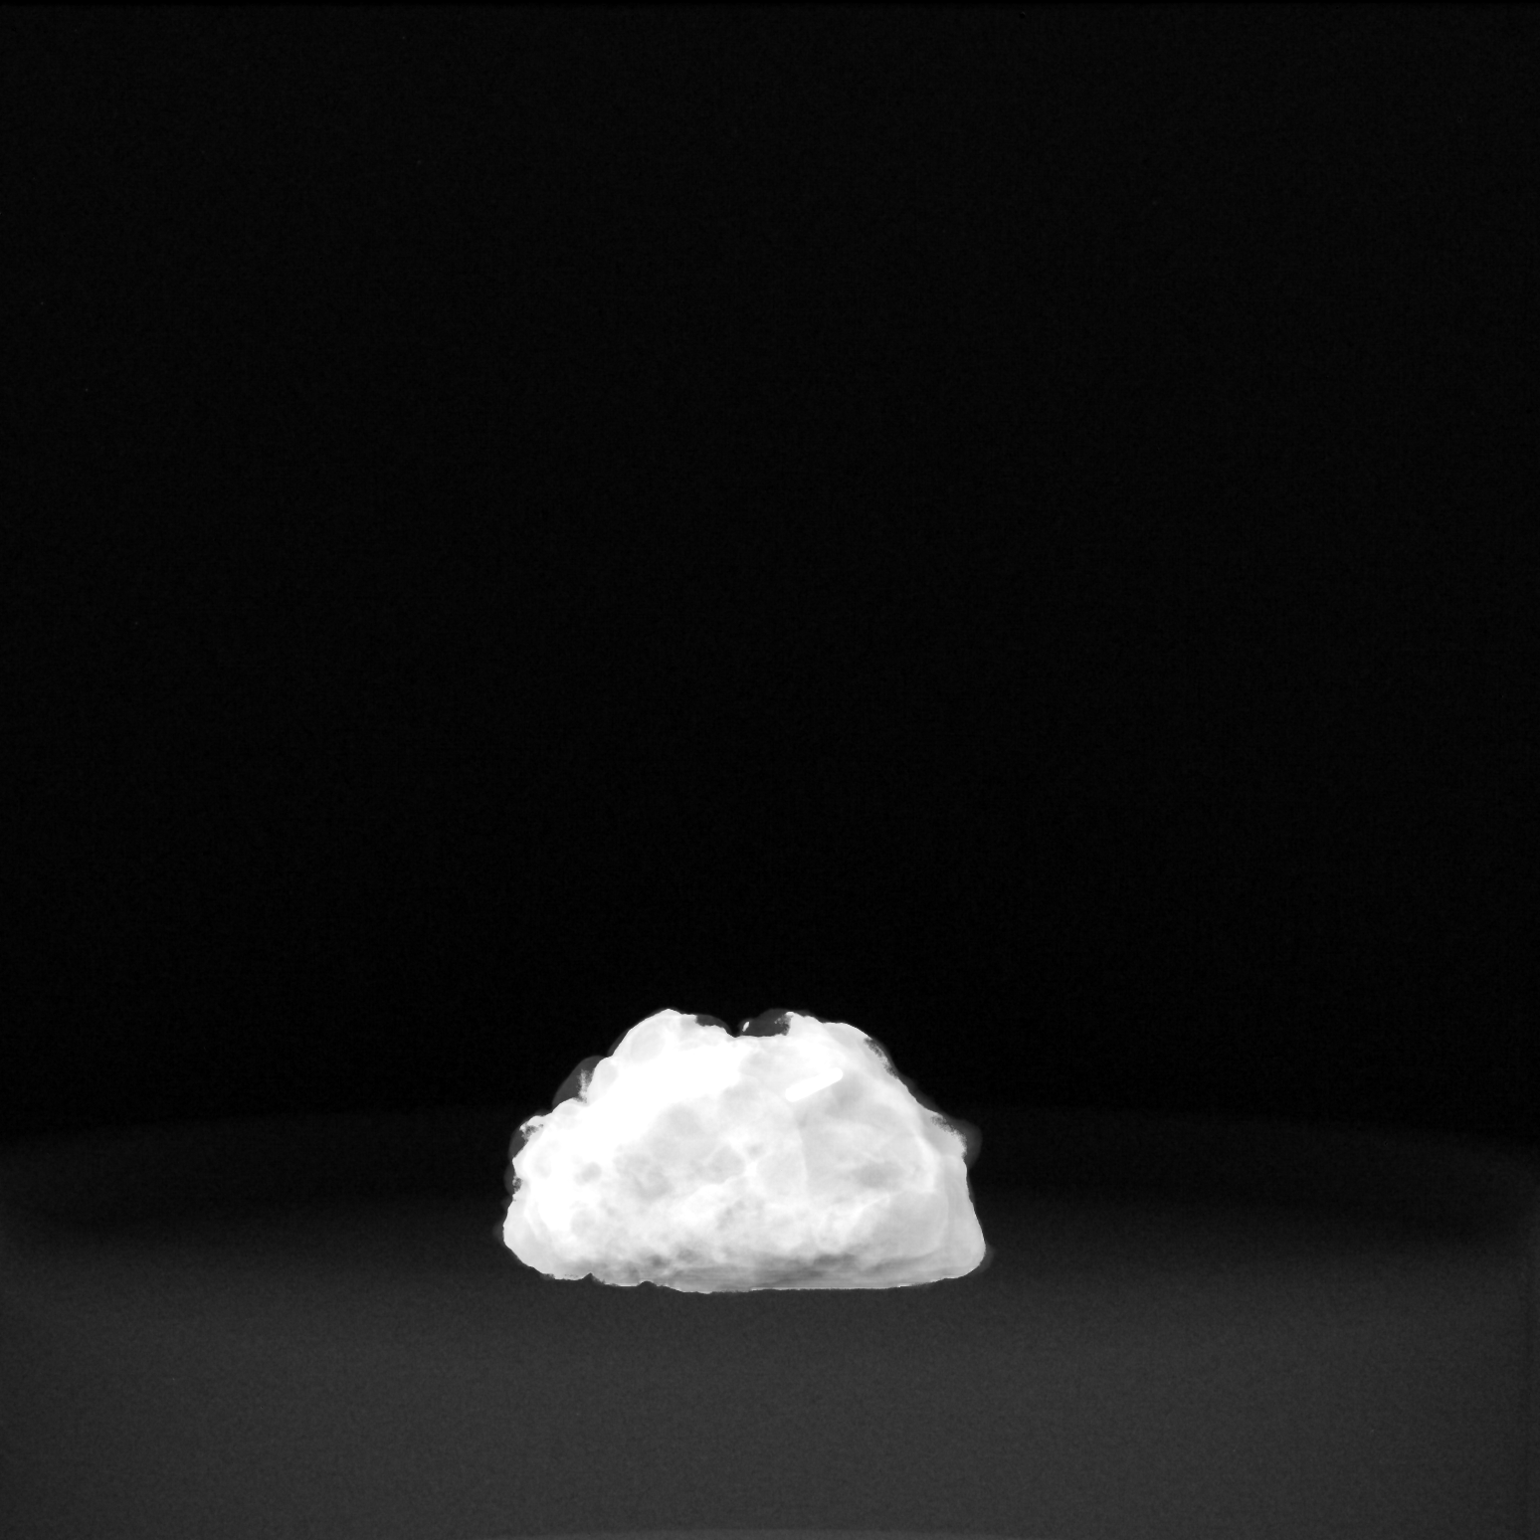

[2 of 2 positions shown; findings below may reference images not displayed]

FINDINGS: Status post excision of the left breast. The radioactive seed and
biopsy marker clip are present, completely intact, and were marked
for pathology.
IMPRESSION: Specimen radiograph of the left breast.
